# Patient Record
Sex: Female | Born: 1995 | Race: Asian | Hispanic: No | Marital: Single | Smoking: Never smoker
Health system: Southern US, Community
[De-identification: ages and names within clinical notes are randomized; demographics above are authoritative.]

## PROBLEM LIST (undated history)

## (undated) DIAGNOSIS — N6310 Unspecified lump in the right breast, unspecified quadrant: Secondary | ICD-10-CM

## (undated) DIAGNOSIS — K759 Inflammatory liver disease, unspecified: Secondary | ICD-10-CM

## (undated) HISTORY — PX: NO PAST SURGERIES: SHX2092

---
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
## 2017-04-25 ENCOUNTER — Emergency Department (HOSPITAL_COMMUNITY)
Admission: EM | Admit: 2017-04-25 | Discharge: 2017-04-26 | Disposition: A | Discharge status: Home / Self Care | Payer: Self-pay | Attending: Emergency Medicine | Admitting: Emergency Medicine

## 2017-04-25 ENCOUNTER — Other Ambulatory Visit: Payer: Self-pay

## 2017-04-25 ENCOUNTER — Encounter (HOSPITAL_COMMUNITY): Payer: Self-pay

## 2017-04-25 DIAGNOSIS — G43009 Migraine without aura, not intractable, without status migrainosus: Secondary | ICD-10-CM | POA: Insufficient documentation

## 2017-04-25 LAB — COMPREHENSIVE METABOLIC PANEL
ALT: 48 U/L (ref 14–54)
ANION GAP: 9 (ref 5–15)
AST: 32 U/L (ref 15–41)
Albumin: 4.3 g/dL (ref 3.5–5.0)
Alkaline Phosphatase: 44 U/L (ref 38–126)
BILIRUBIN TOTAL: 0.7 mg/dL (ref 0.3–1.2)
BUN: 6 mg/dL (ref 6–20)
CHLORIDE: 103 mmol/L (ref 101–111)
CO2: 24 mmol/L (ref 22–32)
Calcium: 9.4 mg/dL (ref 8.9–10.3)
Creatinine, Ser: 0.71 mg/dL (ref 0.44–1.00)
GFR calc Af Amer: >60 mL/min (ref >60)
GFR calc non Af Amer: >60 mL/min (ref >60)
Glucose, Bld: 91 mg/dL (ref 65–99)
POTASSIUM: 3.6 mmol/L (ref 3.5–5.1)
SODIUM: 136 mmol/L (ref 135–145)
TOTAL PROTEIN: 7.7 g/dL (ref 6.5–8.1)

## 2017-04-25 LAB — LIPASE, BLOOD: LIPASE: 28 U/L (ref 11–51)

## 2017-04-25 LAB — URINALYSIS, ROUTINE W REFLEX MICROSCOPIC
BILIRUBIN URINE: NEGATIVE
Glucose, UA: NEGATIVE mg/dL
HGB URINE DIPSTICK: NEGATIVE
Ketones, ur: 20 mg/dL — AB
NITRITE: NEGATIVE
PROTEIN: 30 mg/dL — AB
Specific Gravity, Urine: 1.023 (ref 1.005–1.030)
pH: 8 (ref 5.0–8.0)

## 2017-04-25 LAB — CBC
HEMATOCRIT: 41.2 % (ref 36.0–46.0)
HEMOGLOBIN: 14.3 g/dL (ref 12.0–15.0)
MCH: 26.9 pg (ref 26.0–34.0)
MCHC: 34.7 g/dL (ref 30.0–36.0)
MCV: 77.4 fL — ABNORMAL LOW (ref 78.0–100.0)
Platelets: 276 10*3/uL (ref 150–400)
RBC: 5.32 MIL/uL — ABNORMAL HIGH (ref 3.87–5.11)
RDW: 13.6 % (ref 11.5–15.5)
WBC: 13.3 10*3/uL — ABNORMAL HIGH (ref 4.0–10.5)

## 2017-04-25 MED ORDER — DIPHENHYDRAMINE HCL 50 MG/ML IJ SOLN
25.0000 mg | Freq: Once | INTRAMUSCULAR | Status: AC
  Administered 2017-04-25: 25 mg via INTRAVENOUS
  Filled 2017-04-25: qty 1

## 2017-04-25 MED ORDER — ACETAMINOPHEN 500 MG PO TABS
1000.0000 mg | ORAL_TABLET | Freq: Once | ORAL | Status: AC
  Administered 2017-04-25: 1000 mg via ORAL
  Filled 2017-04-25: qty 2

## 2017-04-25 MED ORDER — SODIUM CHLORIDE 0.9 % IV BOLUS (SEPSIS)
1000.0000 mL | Freq: Once | INTRAVENOUS | Status: AC
  Administered 2017-04-25: 1000 mL via INTRAVENOUS

## 2017-04-25 MED ORDER — METOCLOPRAMIDE HCL 5 MG/ML IJ SOLN
10.0000 mg | Freq: Once | INTRAMUSCULAR | Status: AC
  Administered 2017-04-25: 10 mg via INTRAVENOUS
  Filled 2017-04-25: qty 2

## 2017-04-25 NOTE — ED Triage Notes (Signed)
Pt states that she has been having a headache and vomiting for the past two days. Pt reports being [redacted] weeks pregnant. Hx of migraines. Some abd pain on and off, generalized, denies diarrhea. Denies urinary symptoms or discharge

## 2017-04-25 NOTE — ED Provider Notes (Signed)
TIME SEEN: 11:36 PM  CHIEF COMPLAINT: Migraine headache  HPI: Patient is a 21 year old female with history of migraine headaches who presents to the emergency department with a frontal migraine for the past 2 days.  Describes it as moderate, throbbing in nature worse with lights.  States normally she takes Tylenol or ibuprofen for the pain or goes to sleep.  She did not take any medication prior to arrival because she is [redacted] weeks pregnant.  She has had nausea and vomiting which is also typical of her migraines.  No fever, neck pain or neck stiffness.  No head injury.  No numbness, tingling or focal weakness.  No abdominal pain, vaginal bleeding or discharge.  Last menstrual period was September 12.  She is being followed by the health department but has not yet had an ultrasound.  She is a G1 P0.  ROS: See HPI Constitutional: no fever  Eyes: no drainage  ENT: no runny nose   Cardiovascular:  no chest pain  Resp: no SOB  GI: no vomiting GU: no dysuria Integumentary: no rash  Allergy: no hives  Musculoskeletal: no leg swelling  Neurological: no slurred speech ROS otherwise negative  PAST MEDICAL HISTORY/PAST SURGICAL HISTORY:  History reviewed. No pertinent past medical history.  MEDICATIONS:  Prior to Admission medications   Medication Sig Start Date End Date Taking? Authorizing Provider  ondansetron (ZOFRAN-ODT) 4 MG disintegrating tablet Take 1 tablet (4 mg total) by mouth once. 07/01/14   Lurene ShadowPhelps, Erin O, PA-C    ALLERGIES:  No Known Allergies  SOCIAL HISTORY:  Social History   Tobacco Use  . Smoking status: Never Smoker  . Smokeless tobacco: Never Used  Substance Use Topics  . Alcohol use: No    Frequency: Never    FAMILY HISTORY: No family history on file.  EXAM: BP 102/71   Pulse 85   Temp 99 F (37.2 C) (Oral)   Resp 20   Ht 5\' 1"  (1.549 m)   Wt 50.8 kg (112 lb)   SpO2 100%   BMI 21.16 kg/m  CONSTITUTIONAL: Alert and oriented and responds appropriately to  questions. Well-appearing; well-nourished HEAD: Normocephalic EYES: Conjunctivae clear, pupils appear equal, EOMI ENT: normal nose; moist mucous membranes NECK: Supple, no meningismus, no nuchal rigidity, no LAD  CARD: RRR; S1 and S2 appreciated; no murmurs, no clicks, no rubs, no gallops RESP: Normal chest excursion without splinting or tachypnea; breath sounds clear and equal bilaterally; no wheezes, no rhonchi, no rales, no hypoxia or respiratory distress, speaking full sentences ABD/GI: Normal bowel sounds; non-distended; soft, non-tender, no rebound, no guarding, no peritoneal signs, no hepatosplenomegaly BACK:  The back appears normal and is non-tender to palpation, there is no CVA tenderness EXT: Normal ROM in all joints; non-tender to palpation; no edema; normal capillary refill; no cyanosis, no calf tenderness or swelling    SKIN: Normal color for age and race; warm; no rash NEURO: Moves all extremities equally, strength 5/5 in all 4 extremities, cranial nerves II through XII intact, normal speech, sensation to light touch intact diffusely PSYCH: The patient's mood and manner are appropriate. Grooming and personal hygiene are appropriate.  MEDICAL DECISION MAKING: Patient here with complaints of a migraine headache.  Has history of similar headaches.  No sudden onset, thunderclap, worst headache of her life.  No neurologic deficits.  No fever or meningismus.  Doubt intracranial hemorrhage, cavernous sinus thrombosis, stroke, meningitis or encephalitis.  I do not feel she needs emergent imaging.  Will give IV  fluids, Reglan, Benadryl and Tylenol as these medications are safe in pregnancy.  Patient comfortable with this plan.  Labs obtained in triage are unremarkable other than mild leukocytosis which can be seen in pregnancy.  She does have small amount of ketones in her urine but we will hydrate her.  No UTI.  Patient would be 10 weeks, 1 day pregnant based on her LMP.  ED PROGRESS: She has  headache and nausea completely resolved.  Will fluid challenge prior to discharge.  Have recommended Tylenol at home as needed for headache.  She has follow-up with the health department.  Discussed return precautions.  Patient and partner are comfortable with plan.   At this time, I do not feel there is any life-threatening condition present. I have reviewed and discussed all results (EKG, imaging, lab, urine as appropriate) and exam findings with patient/family. I have reviewed nursing notes and appropriate previous records.  I feel the patient is safe to be discharged home without further emergent workup and can continue workup as an outpatient as needed. Discussed usual and customary return precautions. Patient/family verbalize understanding and are comfortable with this plan.  Outpatient follow-up has been provided if needed. All questions have been answered.      Sham Alviar, Layla MawKristen N, DO 04/26/17 [phone]

## 2017-04-26 NOTE — Discharge Instructions (Signed)
You may take Tylenol 1000 mg every 6 hours as needed for pain.  Please do not take NSAIDs such as ibuprofen, aspirin, Aleve as these are not safe in pregnancy.

## 2017-04-29 ENCOUNTER — Encounter (HOSPITAL_COMMUNITY): Payer: Self-pay | Admitting: *Deleted

## 2017-04-29 ENCOUNTER — Other Ambulatory Visit: Payer: Self-pay

## 2017-04-29 DIAGNOSIS — R51 Headache: Secondary | ICD-10-CM | POA: Insufficient documentation

## 2017-04-29 DIAGNOSIS — R110 Nausea: Secondary | ICD-10-CM | POA: Insufficient documentation

## 2017-04-29 DIAGNOSIS — N390 Urinary tract infection, site not specified: Secondary | ICD-10-CM | POA: Insufficient documentation

## 2017-04-29 LAB — URINALYSIS, ROUTINE W REFLEX MICROSCOPIC
Bilirubin Urine: NEGATIVE
Glucose, UA: NEGATIVE mg/dL
Hgb urine dipstick: NEGATIVE
Ketones, ur: 20 mg/dL — AB
NITRITE: NEGATIVE
PH: 5 (ref 5.0–8.0)
PROTEIN: 30 mg/dL — AB
Specific Gravity, Urine: 1.03 (ref 1.005–1.030)

## 2017-04-29 LAB — I-STAT CHEM 8, ED
BUN: 9 mg/dL (ref 6–20)
CALCIUM ION: 1.19 mmol/L (ref 1.15–1.40)
CHLORIDE: 101 mmol/L (ref 101–111)
Creatinine, Ser: 0.6 mg/dL (ref 0.44–1.00)
Glucose, Bld: 100 mg/dL — ABNORMAL HIGH (ref 65–99)
HCT: 44 % (ref 36.0–46.0)
Hemoglobin: 15 g/dL (ref 12.0–15.0)
Potassium: 3.6 mmol/L (ref 3.5–5.1)
SODIUM: 137 mmol/L (ref 135–145)
TCO2: 23 mmol/L (ref 22–32)

## 2017-04-29 NOTE — ED Triage Notes (Signed)
Pt states that she is pregnant, 9 weeks tomorrow) and has continued to have nausea and vomiting.  Pt has hx of migraines and continues to have a severe HA with photophobia (headache alternates between back of head and right front).  Pt states that her nausea has prevented her from really eating or drinking much for the past week.

## 2017-04-30 ENCOUNTER — Emergency Department (HOSPITAL_COMMUNITY)
Admission: EM | Admit: 2017-04-30 | Discharge: 2017-04-30 | Disposition: A | Discharge status: Home / Self Care | Payer: Self-pay | Attending: Emergency Medicine | Admitting: Emergency Medicine

## 2017-04-30 DIAGNOSIS — R51 Headache: Secondary | ICD-10-CM

## 2017-04-30 DIAGNOSIS — R110 Nausea: Secondary | ICD-10-CM

## 2017-04-30 DIAGNOSIS — R519 Headache, unspecified: Secondary | ICD-10-CM

## 2017-04-30 DIAGNOSIS — N390 Urinary tract infection, site not specified: Secondary | ICD-10-CM

## 2017-04-30 MED ORDER — SODIUM CHLORIDE 0.9 % IV BOLUS (SEPSIS)
1000.0000 mL | Freq: Once | INTRAVENOUS | Status: AC
  Administered 2017-04-30: 1000 mL via INTRAVENOUS

## 2017-04-30 MED ORDER — DIPHENHYDRAMINE HCL 50 MG/ML IJ SOLN
25.0000 mg | Freq: Once | INTRAMUSCULAR | Status: AC
  Administered 2017-04-30: 25 mg via INTRAVENOUS
  Filled 2017-04-30: qty 1

## 2017-04-30 MED ORDER — METOCLOPRAMIDE HCL 5 MG/ML IJ SOLN
10.0000 mg | Freq: Once | INTRAMUSCULAR | Status: AC
  Administered 2017-04-30: 10 mg via INTRAVENOUS
  Filled 2017-04-30: qty 2

## 2017-04-30 MED ORDER — CEPHALEXIN 500 MG PO CAPS: 500.0000 mg | ORAL_CAPSULE | Freq: Two times a day (BID) | ORAL | 0 refills | Status: DC

## 2017-04-30 MED ORDER — ACETAMINOPHEN 500 MG PO TABS
1000.0000 mg | ORAL_TABLET | Freq: Once | ORAL | Status: AC
  Administered 2017-04-30: 1000 mg via ORAL
  Filled 2017-04-30: qty 2

## 2017-04-30 MED ORDER — DEXTROSE 5 % IV SOLN
1.0000 g | Freq: Once | INTRAVENOUS | Status: AC
  Administered 2017-04-30: 1 g via INTRAVENOUS
  Filled 2017-04-30: qty 10

## 2017-04-30 NOTE — ED Provider Notes (Signed)
[facility] Provider Note   CSN: [id number] Arrival date & time: 04/29/17  2134     History   Chief Complaint Chief Complaint  Patient presents with  . Morning Sickness  . Headache    HPI Carrie Erickson is a 21 y.o. female.  Patient presents to the emergency department with a chief complaint of migraine.  She reports a history of migraines.  States that this headache feels similar to her prior migraines.  She reports that the symptoms started about 4 days ago.  She reports that her symptoms have gradually worsened.  She reports associated phonophobia and photophobia.  She denies any numbness, weakness, or tingling.  Denies any vision changes or speech changes.  Of note, patient is [redacted] weeks pregnant.  She was seen last week for the same and had good improvement after migraine cocktail.  She states that she does not see a headache specialist.  She denies any lower abdominal pain, vaginal bleeding, or dysuria.   The history is provided by the patient. No language interpreter was used.    History reviewed. No pertinent past medical history.  There are no active problems to display for this patient.   History reviewed. No pertinent surgical history.  OB History    Gravida Para Term Preterm AB Living   1             SAB TAB Ectopic Multiple Live Births                   Home Medications    Prior to Admission medications   Medication Sig Start Date End Date Taking? Authorizing Provider  ondansetron (ZOFRAN-ODT) 4 MG disintegrating tablet Take 1 tablet (4 mg total) by mouth once. 07/01/14   Lurene ShadowPhelps, Erin O, PA-C    Family History No family history on file.  Social History Social History   Tobacco Use  . Smoking status: Never Smoker  . Smokeless tobacco: Never Used  Substance Use Topics  . Alcohol use: No    Frequency: Never  . Drug use: No     Allergies   Patient has no known allergies.   Review of Systems Review of  Systems  All other systems reviewed and are negative.    Physical Exam Updated Vital Signs BP 122/77 (BP Location: Right Arm)   Pulse 96   Temp 98.8 F (37.1 C) (Oral)   Resp 16   LMP  (Exact Date)   SpO2 100%   Physical Exam  Constitutional: She is oriented to person, place, and time. She appears well-developed and well-nourished.  HENT:  Head: Normocephalic and atraumatic.  Right Ear: External ear normal.  Left Ear: External ear normal.  Eyes: Conjunctivae and EOM are normal. Pupils are equal, round, and reactive to light.  Neck: Normal range of motion. Neck supple.  No pain with neck flexion, no meningismus  Cardiovascular: Normal rate, regular rhythm and normal heart sounds. Exam reveals no gallop and no friction rub.  No murmur heard. Pulmonary/Chest: Effort normal and breath sounds normal. No respiratory distress. She has no wheezes. She has no rales. She exhibits no tenderness.  Abdominal: Soft. She exhibits no distension and no mass. There is no tenderness. There is no rebound and no guarding.  Musculoskeletal: Normal range of motion. She exhibits no edema or tenderness.  Normal gait.  Neurological: She is alert and oriented to person, place, and time. She has normal reflexes.  CN 3-12 intact, normal finger to  nose, no pronator drift, sensation and strength intact bilaterally.  Skin: Skin is warm and dry.  Psychiatric: She has a normal mood and affect. Her behavior is normal. Judgment and thought content normal.  Nursing note and vitals reviewed.    ED Treatments / Results  Labs (all labs ordered are listed, but only abnormal results are displayed) Labs Reviewed  URINALYSIS, ROUTINE W REFLEX MICROSCOPIC - Abnormal; Notable for the following components:      Result Value   Color, Urine AMBER (*)    APPearance CLOUDY (*)    Ketones, ur 20 (*)    Protein, ur 30 (*)    Leukocytes, UA LARGE (*)    Bacteria, UA MANY (*)    Squamous Epithelial / LPF TOO NUMEROUS TO  COUNT (*)    Non Squamous Epithelial 0-5 (*)    All other components within normal limits  I-STAT CHEM 8, ED - Abnormal; Notable for the following components:   Glucose, Bld 100 (*)    All other components within normal limits    EKG  EKG Interpretation None       Radiology No results found.  Procedures Procedures (including critical care time)  Medications Ordered in ED Medications  metoCLOPramide (REGLAN) injection 10 mg (10 mg Intravenous Given 04/30/17 0437)  diphenhydrAMINE (BENADRYL) injection 25 mg (25 mg Intravenous Given 04/30/17 0436)  acetaminophen (TYLENOL) tablet 1,000 mg (1,000 mg Oral Given 04/30/17 0426)  sodium chloride 0.9 % bolus 1,000 mL (1,000 mLs Intravenous New Bag/Given 04/30/17 0436)  cefTRIAXone (ROCEPHIN) 1 g in dextrose 5 % 50 mL IVPB (0 g Intravenous Stopped 04/30/17 0540)     Initial Impression / Assessment and Plan / ED Course  I have reviewed the triage vital signs and the nursing notes.  Pertinent labs & imaging results that were available during my care of the patient were reviewed by me and considered in my medical decision making (see chart for details).     Patient with headache.  Headache is similar to prior migraines.  Will treat symptoms.  Patient has also had some nausea.  Urinalysis is questionable for UTI.  Given patient is pregnant, will treat, and will send for culture.  5:13 AM Patient reports that her headache has significantly improved.  I have advised patient to follow-up with neurology on an outpatient basis for her recurrent headaches.  Vital signs are stable.  Patient understands and agrees with the plan.     Final Clinical Impressions(s) / ED Diagnoses   Final diagnoses:  Nonintractable headache, unspecified chronicity pattern, unspecified headache type  Urinary tract infection without hematuria, site unspecified  Nausea    ED Discharge Orders        Ordered    cephALEXin (KEFLEX) 500 MG capsule  2 times daily      04/30/17 0515       Roxy HorsemanBrowning, Syanna Remmert, PA-C 04/30/17 0544    Zadie RhineWickline, Donald, MD 04/30/17 [phone]

## 2017-04-30 NOTE — ED Notes (Signed)
Aware she needs a urine attempting.

## 2017-04-30 NOTE — ED Notes (Signed)
C/o headache with vomiting onset last week. States she was seen in the ED last Thurs. For same. C/;o decrease appetite.

## 2017-05-01 LAB — URINE CULTURE: Culture: NO GROWTH

## 2017-05-13 DIAGNOSIS — Z3401 Encounter for supervision of normal first pregnancy, first trimester: Secondary | ICD-10-CM | POA: Diagnosis not present

## 2017-05-13 LAB — OB RESULTS CONSOLE HEPATITIS B SURFACE ANTIGEN: HEP B S AG: POSITIVE

## 2017-05-13 LAB — OB RESULTS CONSOLE GC/CHLAMYDIA
Chlamydia: NEGATIVE
Gonorrhea: NEGATIVE

## 2017-05-13 LAB — OB RESULTS CONSOLE ABO/RH: RH Type: POSITIVE

## 2017-05-13 LAB — OB RESULTS CONSOLE HIV ANTIBODY (ROUTINE TESTING): HIV: NONREACTIVE

## 2017-05-13 LAB — OB RESULTS CONSOLE ANTIBODY SCREEN: ANTIBODY SCREEN: NEGATIVE

## 2017-05-13 LAB — OB RESULTS CONSOLE RUBELLA ANTIBODY, IGM: Rubella: IMMUNE

## 2017-05-13 LAB — OB RESULTS CONSOLE RPR: RPR: NONREACTIVE

## 2017-05-14 ENCOUNTER — Other Ambulatory Visit (HOSPITAL_COMMUNITY): Payer: Self-pay | Admitting: Nurse Practitioner

## 2017-05-14 DIAGNOSIS — Z3A13 13 weeks gestation of pregnancy: Secondary | ICD-10-CM

## 2017-05-14 DIAGNOSIS — Z369 Encounter for antenatal screening, unspecified: Secondary | ICD-10-CM

## 2017-05-23 ENCOUNTER — Encounter (HOSPITAL_COMMUNITY): Payer: Self-pay | Admitting: Pediatrics

## 2017-05-29 ENCOUNTER — Encounter (HOSPITAL_COMMUNITY): Payer: Self-pay | Admitting: *Deleted

## 2017-05-31 ENCOUNTER — Other Ambulatory Visit (HOSPITAL_COMMUNITY): Payer: Self-pay | Admitting: *Deleted

## 2017-05-31 ENCOUNTER — Encounter (HOSPITAL_COMMUNITY): Payer: Self-pay

## 2017-05-31 ENCOUNTER — Ambulatory Visit (HOSPITAL_COMMUNITY): Admission: RE | Admit: 2017-05-31 | Payer: Self-pay | Source: Ambulatory Visit

## 2017-05-31 ENCOUNTER — Other Ambulatory Visit (HOSPITAL_COMMUNITY): Payer: Self-pay | Admitting: Nurse Practitioner

## 2017-05-31 ENCOUNTER — Ambulatory Visit (HOSPITAL_COMMUNITY)
Admission: RE | Admit: 2017-05-31 | Discharge: 2017-05-31 | Disposition: A | Discharge status: Home / Self Care | Payer: Self-pay | Source: Ambulatory Visit | Attending: Nurse Practitioner | Admitting: Nurse Practitioner

## 2017-05-31 DIAGNOSIS — Z3A13 13 weeks gestation of pregnancy: Secondary | ICD-10-CM

## 2017-05-31 DIAGNOSIS — Z3687 Encounter for antenatal screening for uncertain dates: Secondary | ICD-10-CM

## 2017-05-31 DIAGNOSIS — Z369 Encounter for antenatal screening, unspecified: Secondary | ICD-10-CM

## 2017-05-31 DIAGNOSIS — Z3A11 11 weeks gestation of pregnancy: Secondary | ICD-10-CM | POA: Insufficient documentation

## 2017-05-31 DIAGNOSIS — Z3682 Encounter for antenatal screening for nuchal translucency: Secondary | ICD-10-CM

## 2017-06-07 ENCOUNTER — Encounter (HOSPITAL_COMMUNITY): Payer: Self-pay

## 2017-06-07 ENCOUNTER — Ambulatory Visit (HOSPITAL_COMMUNITY)
Admission: RE | Admit: 2017-06-07 | Discharge: 2017-06-07 | Disposition: A | Discharge status: Home / Self Care | Payer: Self-pay | Source: Ambulatory Visit | Attending: Nurse Practitioner | Admitting: Nurse Practitioner

## 2017-06-07 DIAGNOSIS — Z3682 Encounter for antenatal screening for nuchal translucency: Secondary | ICD-10-CM | POA: Insufficient documentation

## 2017-06-07 DIAGNOSIS — Z3A12 12 weeks gestation of pregnancy: Secondary | ICD-10-CM | POA: Insufficient documentation

## 2017-06-17 ENCOUNTER — Other Ambulatory Visit (HOSPITAL_COMMUNITY): Payer: Self-pay

## 2017-06-18 NOTE — L&D Delivery Note (Addendum)
Patient is a 22 y.o. now G1P1001 s/p NSVD at 5211w4d, who was admitted for IOL for BPP 6/8.  She progressed with augmentation (AROM, Pitocin, FB) to complete and pushed ~45 minutes to deliver.  Cord clamping delayed by 1-3 minutes then clamped by me with supervision and cut by FOB.  Placenta intact and spontaneous, bleeding minimal.  2nd degree vaginal and R labial lacerations repaired without difficulty. She is undecided for birth control but plans to use condoms in the interim.  Delivery Note At 4:12 AM a viable female was delivered via Vaginal, Spontaneous (Presentation: ROA).  APGAR: 9, 9; weight pending.   Placenta status: intact.  Cord: 3V with the following complications: none.  Cord pH: N/A  Anesthesia:  Epidural Episiotomy: None Lacerations: 2nd degree;Vaginal;Labial Suture Repair: 3.0 vicryl rapide Est. Blood Loss (mL):  200  Mom to postpartum.  Baby to Couplet care / Skin to Skin.  Ellwood DenseAlison Rumball, DO 12/21/17, 4:56 AM  Midwife attestation: I was gloved and present for delivery in its entirety and I agree with the above resident's note.  Donette LarryMelanie Demon Volante, CNM 5:29 AM

## 2017-11-21 LAB — OB RESULTS CONSOLE GC/CHLAMYDIA
CHLAMYDIA, DNA PROBE: NEGATIVE
Gonorrhea: NEGATIVE

## 2017-11-21 LAB — OB RESULTS CONSOLE GBS: STREP GROUP B AG: NEGATIVE

## 2017-12-18 ENCOUNTER — Other Ambulatory Visit (HOSPITAL_COMMUNITY): Payer: Self-pay | Admitting: Nurse Practitioner

## 2017-12-18 ENCOUNTER — Telehealth (HOSPITAL_COMMUNITY): Payer: Self-pay | Admitting: *Deleted

## 2017-12-18 DIAGNOSIS — O480 Post-term pregnancy: Secondary | ICD-10-CM

## 2017-12-18 NOTE — Telephone Encounter (Signed)
Preadmission screen  

## 2017-12-20 ENCOUNTER — Ambulatory Visit (HOSPITAL_BASED_OUTPATIENT_CLINIC_OR_DEPARTMENT_OTHER)
Admission: RE | Admit: 2017-12-20 | Discharge: 2017-12-20 | Disposition: A | Discharge status: Home / Self Care | Payer: Medicaid Other | Source: Ambulatory Visit | Attending: Nurse Practitioner | Admitting: Nurse Practitioner

## 2017-12-20 ENCOUNTER — Inpatient Hospital Stay (HOSPITAL_COMMUNITY): Payer: Medicaid Other | Admitting: Anesthesiology

## 2017-12-20 ENCOUNTER — Other Ambulatory Visit: Payer: Self-pay

## 2017-12-20 ENCOUNTER — Encounter (HOSPITAL_COMMUNITY): Payer: Self-pay | Admitting: *Deleted

## 2017-12-20 ENCOUNTER — Inpatient Hospital Stay (HOSPITAL_COMMUNITY)
Admission: AD | Admit: 2017-12-20 | Discharge: 2017-12-23 | DRG: 806 | Disposition: A | Discharge status: Home / Self Care | Payer: Medicaid Other | Attending: Family Medicine | Admitting: Family Medicine

## 2017-12-20 DIAGNOSIS — O480 Post-term pregnancy: Secondary | ICD-10-CM

## 2017-12-20 DIAGNOSIS — B1910 Unspecified viral hepatitis B without hepatic coma: Secondary | ICD-10-CM

## 2017-12-20 DIAGNOSIS — Z3A40 40 weeks gestation of pregnancy: Secondary | ICD-10-CM | POA: Insufficient documentation

## 2017-12-20 DIAGNOSIS — O9842 Viral hepatitis complicating childbirth: Secondary | ICD-10-CM | POA: Diagnosis present

## 2017-12-20 DIAGNOSIS — Z3480 Encounter for supervision of other normal pregnancy, unspecified trimester: Secondary | ICD-10-CM

## 2017-12-20 DIAGNOSIS — Z9289 Personal history of other medical treatment: Secondary | ICD-10-CM

## 2017-12-20 DIAGNOSIS — O98419 Viral hepatitis complicating pregnancy, unspecified trimester: Secondary | ICD-10-CM

## 2017-12-20 DIAGNOSIS — Z3400 Encounter for supervision of normal first pregnancy, unspecified trimester: Secondary | ICD-10-CM

## 2017-12-20 HISTORY — DX: Unspecified viral hepatitis B without hepatic coma: B19.10

## 2017-12-20 HISTORY — DX: Personal history of other medical treatment: Z92.89

## 2017-12-20 HISTORY — DX: Encounter for supervision of normal first pregnancy, unspecified trimester: Z34.00

## 2017-12-20 HISTORY — DX: Inflammatory liver disease, unspecified: K75.9

## 2017-12-20 HISTORY — DX: Unspecified lump in the right breast, unspecified quadrant: N63.10

## 2017-12-20 HISTORY — DX: Unspecified viral hepatitis B without hepatic coma: O98.419

## 2017-12-20 LAB — CBC
HCT: 41.4 % (ref 36.0–46.0)
Hemoglobin: 14.2 g/dL (ref 12.0–15.0)
MCH: 27.8 pg (ref 26.0–34.0)
MCHC: 34.3 g/dL (ref 30.0–36.0)
MCV: 81.2 fL (ref 78.0–100.0)
Platelets: 235 10*3/uL (ref 150–400)
RBC: 5.1 MIL/uL (ref 3.87–5.11)
RDW: 13.7 % (ref 11.5–15.5)
WBC: 10.1 10*3/uL (ref 4.0–10.5)

## 2017-12-20 LAB — TYPE AND SCREEN
ABO/RH(D): A POS
Antibody Screen: NEGATIVE

## 2017-12-20 LAB — ABO/RH: ABO/RH(D): A POS

## 2017-12-20 MED ORDER — ONDANSETRON HCL 4 MG/2ML IJ SOLN: 4.0000 mg | Freq: Four times a day (QID) | INTRAMUSCULAR | Status: DC | PRN

## 2017-12-20 MED ORDER — DIPHENHYDRAMINE HCL 50 MG/ML IJ SOLN: 12.5000 mg | INTRAMUSCULAR | Status: DC | PRN

## 2017-12-20 MED ORDER — LIDOCAINE HCL (PF) 1 % IJ SOLN
30.0000 mL | INTRAMUSCULAR | Status: DC | PRN
  Filled 2017-12-20: qty 30

## 2017-12-20 MED ORDER — LACTATED RINGERS IV SOLN
500.0000 mL | Freq: Once | INTRAVENOUS | Status: AC
  Administered 2017-12-20: 500 mL via INTRAVENOUS

## 2017-12-20 MED ORDER — EPHEDRINE 5 MG/ML INJ
10.0000 mg | INTRAVENOUS | Status: DC | PRN
  Filled 2017-12-20: qty 2

## 2017-12-20 MED ORDER — PHENYLEPHRINE 40 MCG/ML (10ML) SYRINGE FOR IV PUSH (FOR BLOOD PRESSURE SUPPORT)
80.0000 ug | PREFILLED_SYRINGE | INTRAVENOUS | Status: DC | PRN
  Filled 2017-12-20: qty 5
  Filled 2017-12-20: qty 10

## 2017-12-20 MED ORDER — PHENYLEPHRINE 40 MCG/ML (10ML) SYRINGE FOR IV PUSH (FOR BLOOD PRESSURE SUPPORT)
80.0000 ug | PREFILLED_SYRINGE | INTRAVENOUS | Status: DC | PRN
  Filled 2017-12-20: qty 5

## 2017-12-20 MED ORDER — OXYTOCIN BOLUS FROM INFUSION
500.0000 mL | Freq: Once | INTRAVENOUS | Status: AC
  Administered 2017-12-21: 500 mL via INTRAVENOUS

## 2017-12-20 MED ORDER — ACETAMINOPHEN 325 MG PO TABS: 650.0000 mg | ORAL_TABLET | ORAL | Status: DC | PRN

## 2017-12-20 MED ORDER — OXYTOCIN 40 UNITS IN LACTATED RINGERS INFUSION - SIMPLE MED
2.5000 [IU]/h | INTRAVENOUS | Status: DC
  Administered 2017-12-21: 2.5 [IU]/h via INTRAVENOUS
  Filled 2017-12-20: qty 1000

## 2017-12-20 MED ORDER — FENTANYL 2.5 MCG/ML BUPIVACAINE 1/10 % EPIDURAL INFUSION (WH - ANES)
14.0000 mL/h | INTRAMUSCULAR | Status: DC | PRN
  Administered 2017-12-20: 14 mL/h via EPIDURAL
  Filled 2017-12-20: qty 100

## 2017-12-20 MED ORDER — LACTATED RINGERS IV SOLN: 500.0000 mL | INTRAVENOUS | Status: DC | PRN

## 2017-12-20 MED ORDER — OXYTOCIN 40 UNITS IN LACTATED RINGERS INFUSION - SIMPLE MED
1.0000 m[IU]/min | INTRAVENOUS | Status: DC
  Administered 2017-12-20: 1 m[IU]/min via INTRAVENOUS

## 2017-12-20 MED ORDER — SOD CITRATE-CITRIC ACID 500-334 MG/5ML PO SOLN: 30.0000 mL | ORAL | Status: DC | PRN

## 2017-12-20 MED ORDER — OXYCODONE-ACETAMINOPHEN 5-325 MG PO TABS: 2.0000 | ORAL_TABLET | ORAL | Status: DC | PRN

## 2017-12-20 MED ORDER — TERBUTALINE SULFATE 1 MG/ML IJ SOLN
0.2500 mg | Freq: Once | INTRAMUSCULAR | Status: DC | PRN
  Filled 2017-12-20: qty 1

## 2017-12-20 MED ORDER — LACTATED RINGERS IV SOLN: 500.0000 mL | Freq: Once | INTRAVENOUS | Status: DC

## 2017-12-20 MED ORDER — OXYCODONE-ACETAMINOPHEN 5-325 MG PO TABS: 1.0000 | ORAL_TABLET | ORAL | Status: DC | PRN

## 2017-12-20 MED ORDER — LIDOCAINE HCL (PF) 1 % IJ SOLN
INTRAMUSCULAR | Status: DC | PRN
  Administered 2017-12-20: 3 mL via EPIDURAL
  Administered 2017-12-20: 5 mL via EPIDURAL

## 2017-12-20 MED ORDER — LACTATED RINGERS IV SOLN
INTRAVENOUS | Status: DC
  Administered 2017-12-20 (×3): via INTRAVENOUS

## 2017-12-20 NOTE — Anesthesia Pain Management Evaluation Note (Signed)
  [facility] Pain Management Visit Note  Patient: Carrie Erickson, 22 y.o., female  "Hello I am a [facility] the [facility]. We have an anesthesia team available at all times to provide care throughout the hospital, including epidural management and anesthesia for C-section. I don't know your plan for the delivery whether it a natural birth, water birth, IV sedation, nitrous supplementation, doula or epidural, but we want to meet your pain goals."   1.Was your pain managed to your expectations on prior hospitalizations?   No prior hospitalizations  2.What is your expectation for pain management during this hospitalization?     Epidural  3.How can we help you reach that goal? epidural  Record the patient's initial score and the patient's pain goal.   Pain: 0  Pain Goal: 5 [facility] wants you to be able to say your pain was always managed very well.  Dilynn Munroe 12/20/2017

## 2017-12-20 NOTE — Anesthesia Procedure Notes (Signed)
Epidural Patient location during procedure: OB Start time: 12/20/2017 8:44 PM End time: 12/20/2017 8:53 PM  Staffing Anesthesiologist: Achille RichHodierne, Casyn Becvar, MD Performed: anesthesiologist   Preanesthetic Checklist Completed: patient identified, site marked, pre-op evaluation, timeout performed, IV checked, risks and benefits discussed and monitors and equipment checked  Epidural Patient position: sitting Prep: DuraPrep Patient monitoring: heart rate, cardiac monitor, continuous pulse ox and blood pressure Approach: midline Location: L2-L3 Injection technique: LOR saline  Needle:  Needle type: Tuohy  Needle gauge: 17 G Needle length: 9 cm Needle insertion depth: 4 cm Catheter type: closed end flexible Catheter size: 19 Gauge Catheter at skin depth: 10 cm Test dose: negative and Other  Assessment Events: blood not aspirated, injection not painful, no injection resistance and negative IV test  Additional Notes Informed consent obtained prior to proceeding including risk of failure, 1% risk of PDPH, risk of minor discomfort and bruising.  Discussed rare but serious complications including epidural abscess, permanent nerve injury, epidural hematoma.  Discussed alternatives to epidural analgesia and patient desires to proceed.  Timeout performed pre-procedure verifying patient name, procedure, and platelet count.  Patient tolerated procedure well. Reason for block:procedure for pain

## 2017-12-20 NOTE — H&P (Deleted)
Patient ID: Carrie Erickson, female   DOB: 08/23/1995, 21 y.o.   MRN: [id number] OBSTETRIC ADMISSION HISTORY AND PHYSICAL  Carrie Erickson is a 21 y.o. female G1P0 with IUP at [redacted]w[redacted]d by US at  presenting for IOL due to BPP of 6/8. 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 She reports +FMs, No LOF, no VB, no blurry vision, headaches or peripheral edema, and RUQ pain. She endorses feeling a contraction this morning that was about a 5/10 on pain scale but she was able to breath through it and has felt contractions occasionally throughout the morning. She plans on breast and bottle feeding. She request condoms for birth control at this time but would like to talk about other options later. She received her prenatal care at [facility]   Dating: By US at 11 weeks --->  Estimated Date of Delivery: 12/17/17  Sono:    @[redacted]w[redacted]d, CWD, normal anatomy, Variable presentation   Prenatal History/Complications:  Past Medical History: Past Medical History:  Diagnosis Date  . Breast mass, right   . Hepatitis     Past Surgical History: Past Surgical History:  Procedure Laterality Date  . NO PAST SURGERIES      Obstetrical History: OB History    Gravida  1   Para      Term      Preterm      AB      Living        SAB      TAB      Ectopic      Multiple      Live Births              Social History: Social History   Socioeconomic History  . Marital status: Single    Spouse name: Not on file  . Number of children: Not on file  . Years of education: Not on file  . Highest education level: Not on file  Occupational History  . Not on file  Social Needs  . Financial resource strain: Not on file  . Food insecurity:    Worry: Not on file    Inability: Not on file  . Transportation needs:    Medical: Not on file    Non-medical: Not on file  Tobacco Use  . Smoking status: Never Smoker  . Smokeless tobacco: Never Used  Substance and Sexual Activity  . Alcohol use: No     Frequency: Never  . Drug use: No  . Sexual activity: Yes  Lifestyle  . Physical activity:    Days per week: Not on file    Minutes per session: Not on file  . Stress: Not on file  Relationships  . Social connections:    Talks on phone: Not on file    Gets together: Not on file    Attends religious service: Not on file    Active member of club or organization: Not on file    Attends meetings of clubs or organizations: Not on file    Relationship status: Not on file  Other Topics Concern  . Not on file  Social History Narrative  . Not on file    Family History: History reviewed. No pertinent family history.  Allergies: No Known Allergies  Medications Prior to Admission  Medication Sig Dispense Refill Last Dose  . Prenatal Vit-Fe Fumarate-FA (PRENATAL VITAMIN PO) Take 1 tablet by mouth daily.    12/19/2017 at Unknown time  . Promethazine HCl (PHENERGAN PO)   Take by mouth.   Past Month at Unknown time     Review of Systems   All systems reviewed and negative except as stated in HPI  Blood pressure 130/85, pulse 99, temperature 98.2 F (36.8 C), temperature source Oral, resp. rate 18, height 5' (1.524 m), weight 63.9 kg (140 lb 12.8 oz), last menstrual period 02/27/2017. General appearance: alert, cooperative and no distress Lungs: clear to auscultation bilaterally Heart: regular rate and rhythm Abdomen: soft, mild tenderness; bowel sounds normal Extremities: Homans sign is negative, no sign of DVT Presentation: Vertex Fetal monitoringBaseline: 145 bpm, Variability: Good {> 6 bpm), Accelerations: Present and Decelerations: Absent Uterine activityDate/time of onset: Morning of 12/20/17, Frequency: Occasionally and Intensity: mild Dilation: 1 Effacement (%): 50 Station: -2 Exam by:: Sam CNM   Prenatal labs: ABO, Rh: A/Positive/-- (11/26 0000) Antibody: Negative (11/26 0000) Rubella: Immune (11/26 0000) RPR: Nonreactive (11/26 0000)  HBsAg: Positive (11/26 0000)   HIV: Non-reactive (11/26 0000)  GBS: Negative (06/06 0000)  Anatomy US: Normal at 12 weeks  Prenatal Transfer Tool  Maternal Diabetes: No Genetic Screening: Declined Maternal Ultrasounds/Referrals: Normal Fetal Ultrasounds or other Referrals:  Other: Anatomy scan at 11 weeks Maternal Substance Abuse:  No Significant Maternal Medications:  None Significant Maternal Lab Results: None  Results for orders placed or performed during the hospital encounter of 12/20/17 (from the past 24 hour(s))  CBC   Collection Time: 12/20/17 10:12 AM  Result Value Ref Range   WBC 10.1 4.0 - 10.5 K/uL   RBC 5.10 3.87 - 5.11 MIL/uL   Hemoglobin 14.2 12.0 - 15.0 g/dL   HCT 41.4 36.0 - 46.0 %   MCV 81.2 78.0 - 100.0 fL   MCH 27.8 26.0 - 34.0 pg   MCHC 34.3 30.0 - 36.0 g/dL   RDW 13.7 11.5 - 15.5 %   Platelets 235 150 - 400 K/uL    Patient Active Problem List   Diagnosis Date Noted  . Supervision of normal first pregnancy, antepartum 12/20/2017  . H/O fetal biophysical profile 12/20/2017  . Hep B complicating pregnancy 12/20/2017    Assessment/Plan:  Carrie Erickson is a 21 y.o. G1P0 at [redacted]w[redacted]d here for IOL due to BPP of 6/8 with Hx of Hep B.  #Labor: Not currently in labor #Pain: Labor management without medications #FWB: Category 1 #ID:None #MOF: Breast and Bottle #MOC:Condoms but would be open to discussing other contraceptions methods later  Lexi J Betancourt, Medical Student  12/20/2017, 11:29 AM  RESIDENT ADDENDUM I have separately seen and examined the patient. I have discussed the findings and exam with the medical student and agree with the above note. I helped develop the management plan that is described in the student's note, and I agree with the content.   Dispo: Admit to [facility] Suit[facility] for IOL  Velencia Lenart, CNM 12/20/17  11:59 AM      

## 2017-12-20 NOTE — Progress Notes (Signed)
Labor Progress Note Lanetta Inchhinh Kierstead is a 22 y.o. G1P0 at 6778w3d presented for IOL for nonreassuring NST, BPP 6/8.  S: Patient immediately s/p epidural. No questions at this time.  O:  BP 113/79   Pulse 94   Temp 98.7 F (37.1 C) (Oral)   Resp 19   Ht 5' (1.524 m)   Wt 140 lb 12.8 oz (63.9 kg)   LMP 02/27/2017   SpO2 99%   BMI 27.50 kg/m   WUJ:WJXBJYNWFHT:baseline rate 150, moderate variability, + acels, no decels Toco: ctx every 1-3 min  CVE: Dilation: 6 Effacement (%): 70 Cervical Position: Middle Station: -1 Presentation: Vertex Exam by:: Montez MoritaErin Hampton, RNC  A&P: 22 y.o. G1P0 6678w3d here for IOL for BPP 6/8. Labor: Progressing well. Currently on 8 of Pit, titrating as tolerated. Pain: Epidural FWB: Cat I GBS negative  Ellwood DenseAlison Margarete Horace, DO 9:22 PM

## 2017-12-20 NOTE — Progress Notes (Addendum)
Patient ID: Carrie Erickson, female   DOB: [birth year], 22 y.o.   MRN: [id number] Labor Progress Note  Carrie Erickson is a 22 y.o. G1P0 at 3757w3d  [facility]  S: Tolerating pain well so far. Having irregular contractions with mild pain and states that she does not currently need pain medication but plans to get an epidural later. Denies HA, N/v, vision changes, or RUQ pain.   O:  BP 123/83   Pulse 87   Temp 98.2 F (36.8 C) (Oral)   Resp 16   Ht 5' (1.524 m)   Wt 63.9 kg (140 lb 12.8 oz)   LMP 02/27/2017   BMI 27.50 kg/m   No intake/output data recorded.  FHT:  FHR: 140 bpm, variability: moderate,  accelerations:  Present,  decelerations:  Absent UC:   Irregularly with mild pain SVE:   Dilation: 1 Effacement (%): 50 Station: -2 Exam by:: Sam CNM Membranes still intact  Labs: Lab Results  Component Value Date   WBC 10.1 12/20/2017   HGB 14.2 12/20/2017   HCT 41.4 12/20/2017   MCV 81.2 12/20/2017   PLT 235 12/20/2017    Assessment / Plan: 22 y.o. G1P0 6557w3d by first trimester US presenting for IOL for BPP of 6/8 not currently in labor. IOL for BPP 6/8 this morning  Labor: Not currently in labor  Fetal Wellbeing:  Category I Pain Control:  Labor support without medications Anticipated MOD:  NSVD  Expectant management   [place] PinkLexi Betancourt, MS3 12/20/17 14:06  RESIDENT ADDENDUM I have separately seen and examined the patient. I have discussed the findings and exam with the medical student and agree with the above note. I helped develop the management plan that is described in the student's note, and I agree with the content.  Clayton BiblesSamantha Ifeoluwa Beller, CNM 12/20/17  2:25 PM

## 2017-12-20 NOTE — Progress Notes (Addendum)
Patient ID: Lanetta Inchhinh Weyand, female   DOB: Nov 05, 1995, 22 y.o.   MRN: [id number] Labor Progress Note  Lanetta Inchhinh Kercheval is a 22 y.o. G1P0 at 357w3d  admitted for induction of labor due to BPP of 6/8 this morning.  S: Tolerating it well overall. Pain ins increasing with contractions and is now a 6/10 during contractions. States she does not want any pain medication now but will want and epidural when the pain gets worse. Denies HA , N/V, vision changes, or RUQ pain.   O:  BP 102/64   Pulse 88   Temp 98.5 F (36.9 C) (Oral)   Resp 18   Ht 5' (1.524 m)   Wt 63.9 kg (140 lb 12.8 oz)   LMP 02/27/2017   BMI 27.50 kg/m   No intake/output data recorded.  FHT:  FHR: 135 bpm, variability: moderate,  accelerations:  Present,  decelerations:  Absent UC:   regular, every 3-4 minutes SVE:   Dilation: 4 Effacement (%): 50 Station: -2 Exam by:: Wheinhold CNM Membranes still intact  Pitocin @ 5 mu/min  Labs: Lab Results  Component Value Date   WBC 10.1 12/20/2017   HGB 14.2 12/20/2017   HCT 41.4 12/20/2017   MCV 81.2 12/20/2017   PLT 235 12/20/2017    Assessment / Plan: 22 y.o. G1P0 8257w3d in early labor Induction of labor due to BPP of 6/8.,  progressing well on pitocin  Labor: Progressing on Pitocin, will continue to increase then AROM Fetal Wellbeing:  Category I Pain Control:  Labor support without medications and But will want epidural when pain gets worse Anticipated MOD:  NSVD  Expectant management   [place] PinkLexi Betancourt, MS3 12/20/17 18:41   Clayton BiblesSamantha Terrica Duecker, CNM 12/20/17 7:44 PM

## 2017-12-20 NOTE — H&P (Addendum)
Patient ID: Carrie Erickson, female   DOB: May 09, 1996, 22 y.o.   MRN: [id number] OBSTETRIC ADMISSION HISTORY AND PHYSICAL  Carrie Erickson is a 22 y.o. female G1P0 with IUP at 2423w3d by US at  presenting for IOL due to BPP of 6/8. 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 She reports +FMs, No LOF, no VB, no blurry vision, headaches or peripheral edema, and RUQ pain. She endorses feeling a contraction this morning that was about a 5/10 on pain scale but she was able to breath through it and has felt contractions occasionally throughout the morning. She plans on breast and bottle feeding. She request condoms for birth control at this time but would like to talk about other options later. [facility]   Dating: By US at 11 weeks --->  Estimated Date of Delivery: 12/17/17  Sono:    @[redacted]w[redacted]d , CWD, normal anatomy, Variable presentation   Prenatal History/Complications:  Past Medical History: Past Medical History:  Diagnosis Date  . Breast mass, right   . Hepatitis     Past Surgical History: Past Surgical History:  Procedure Laterality Date  . NO PAST SURGERIES      Obstetrical History: OB History    Gravida  1   Para      Term      Preterm      AB      Living        SAB      TAB      Ectopic      Multiple      Live Births              Social History: Social History   Socioeconomic History  . Marital status: Single    Spouse name: Not on file  . Number of children: Not on file  . Years of education: Not on file  . Highest education level: Not on file  Occupational History  . Not on file  Social Needs  . Financial resource strain: Not on file  . Food insecurity:    Worry: Not on file    Inability: Not on file  . Transportation needs:    Medical: Not on file    Non-medical: Not on file  Tobacco Use  . Smoking status: Never Smoker  . Smokeless tobacco: Never Used  Substance and Sexual Activity  . Alcohol use: No     Frequency: Never  . Drug use: No  . Sexual activity: Yes  Lifestyle  . Physical activity:    Days per week: Not on file    Minutes per session: Not on file  . Stress: Not on file  Relationships  . Social connections:    Talks on phone: Not on file    Gets together: Not on file    Attends religious service: Not on file    Active member of club or organization: Not on file    Attends meetings of clubs or organizations: Not on file    Relationship status: Not on file  Other Topics Concern  . Not on file  Social History Narrative  . Not on file    Family History: History reviewed. No pertinent family history.  Allergies: No Known Allergies  Medications Prior to Admission  Medication Sig Dispense Refill Last Dose  . Prenatal Vit-Fe Fumarate-FA (PRENATAL VITAMIN PO) Take 1 tablet by mouth daily.    12/19/2017 at Unknown time  . Promethazine HCl (PHENERGAN PO)  Take by mouth.   Past Month at Unknown time     Review of Systems   All systems reviewed and negative except as stated in HPI  Blood pressure 130/85, pulse 99, temperature 98.2 F (36.8 C), temperature source Oral, resp. rate 18, height 5' (1.524 m), weight 63.9 kg (140 lb 12.8 oz), last menstrual period 02/27/2017. General appearance: alert, cooperative and no distress Lungs: clear to auscultation bilaterally Heart: regular rate and rhythm Abdomen: soft, mild tenderness; bowel sounds normal Extremities: Homans sign is negative, no sign of DVT Presentation: Vertex Fetal monitoringBaseline: 145 bpm, Variability: Good {> 6 bpm), Accelerations: Present and Decelerations: Absent Uterine activityDate/time of onset: Morning of 12/20/17, Frequency: Occasionally and Intensity: mild Dilation: 1 Effacement (%): 50 Station: -2 Exam by:: Sam CNM   Prenatal labs: ABO, Rh: A/Positive/-- (11/26 0000) Antibody: Negative (11/26 0000) Rubella: Immune (11/26 0000) RPR: Nonreactive (11/26 0000)  HBsAg: Positive (11/26 0000)   HIV: Non-reactive (11/26 0000)  GBS: Negative (06/06 0000)  Anatomy US: Normal at 12 weeks  Prenatal Transfer Tool  Maternal Diabetes: No Genetic Screening: Declined Maternal Ultrasounds/Referrals: Normal Fetal Ultrasounds or other Referrals:  Other: Anatomy scan at 11 weeks Maternal Substance Abuse:  No Significant Maternal Medications:  None Significant Maternal Lab Results: None  Results for orders placed or performed during the hospital encounter of 12/20/17 (from the past 24 hour(s))  CBC   Collection Time: 12/20/17 10:12 AM  Result Value Ref Range   WBC 10.1 4.0 - 10.5 K/uL   RBC 5.10 3.87 - 5.11 MIL/uL   Hemoglobin 14.2 12.0 - 15.0 g/dL   HCT 16.1 09.6 - 04.5 %   MCV 81.2 78.0 - 100.0 fL   MCH 27.8 26.0 - 34.0 pg   MCHC 34.3 30.0 - 36.0 g/dL   RDW 40.9 81.1 - 91.4 %   Platelets 235 150 - 400 K/uL    Patient Active Problem List   Diagnosis Date Noted  . Supervision of normal first pregnancy, antepartum 12/20/2017  . H/O fetal biophysical profile 12/20/2017  . Hep B complicating pregnancy 12/20/2017    Assessment/Plan:  Carrie Erickson is a 22 y.o. G1P0 at [redacted]w[redacted]d here for IOL due to BPP of 6/8 with Hx of Hep B.  #Labor: Not currently in labor #Pain: Labor management without medications #FWB: Category 1 #ID:None #MOF: Breast and Bottle #MOC:Condoms but would be open to discussing other contraceptions methods later  Rolland Bimler, Medical Student  12/20/2017, 11:29 AM  RESIDENT ADDENDUM I have separately seen and examined the patient. I have discussed the findings and exam with the medical student and agree with the above note. I helped develop the management plan that is described in the student's note, and I agree with the content.   Dispo: Admit to [facility] for IOL  Clayton Bibles, [place] 12/20/17  11:59 AM

## 2017-12-20 NOTE — Anesthesia Preprocedure Evaluation (Signed)
Anesthesia Evaluation  Patient identified by MRN, date of birth, ID band Patient awake    Reviewed: Allergy & Precautions, H&P , NPO status , Patient's Chart, lab work & pertinent test results  Airway Mallampati: II   Neck ROM: full    Dental   Pulmonary neg pulmonary ROS,    breath sounds clear to auscultation       Cardiovascular negative cardio ROS   Rhythm:regular Rate:Normal     Neuro/Psych    GI/Hepatic (+) Hepatitis -, B  Endo/Other    Renal/GU      Musculoskeletal   Abdominal   Peds  Hematology   Anesthesia Other Findings   Reproductive/Obstetrics (+) Pregnancy                             Anesthesia Physical Anesthesia Plan  ASA: II  Anesthesia Plan: Epidural   Post-op Pain Management:    Induction: Intravenous  PONV Risk Score and Plan: 2 and Treatment may vary due to age or medical condition  Airway Management Planned: Natural Airway  Additional Equipment:   Intra-op Plan:   Post-operative Plan:   Informed Consent: I have reviewed the patients History and Physical, chart, labs and discussed the procedure including the risks, benefits and alternatives for the proposed anesthesia with the patient or authorized representative who has indicated his/her understanding and acceptance.     Plan Discussed with: Anesthesiologist  Anesthesia Plan Comments:         Anesthesia Quick Evaluation

## 2017-12-21 DIAGNOSIS — Z3A40 40 weeks gestation of pregnancy: Secondary | ICD-10-CM

## 2017-12-21 LAB — RPR: RPR: NONREACTIVE

## 2017-12-21 MED ORDER — DIBUCAINE 1 % RE OINT: 1.0000 "application " | TOPICAL_OINTMENT | RECTAL | Status: DC | PRN

## 2017-12-21 MED ORDER — IBUPROFEN 600 MG PO TABS
600.0000 mg | ORAL_TABLET | Freq: Four times a day (QID) | ORAL | Status: DC
  Administered 2017-12-21 – 2017-12-23 (×8): 600 mg via ORAL
  Filled 2017-12-21 (×9): qty 1

## 2017-12-21 MED ORDER — DIPHENHYDRAMINE HCL 25 MG PO CAPS: 25.0000 mg | ORAL_CAPSULE | Freq: Four times a day (QID) | ORAL | Status: DC | PRN

## 2017-12-21 MED ORDER — SIMETHICONE 80 MG PO CHEW: 80.0000 mg | CHEWABLE_TABLET | ORAL | Status: DC | PRN

## 2017-12-21 MED ORDER — TETANUS-DIPHTH-ACELL PERTUSSIS 5-2.5-18.5 LF-MCG/0.5 IM SUSP: 0.5000 mL | Freq: Once | INTRAMUSCULAR | Status: DC

## 2017-12-21 MED ORDER — ONDANSETRON HCL 4 MG PO TABS: 4.0000 mg | ORAL_TABLET | ORAL | Status: DC | PRN

## 2017-12-21 MED ORDER — PRENATAL MULTIVITAMIN CH
1.0000 | ORAL_TABLET | Freq: Every day | ORAL | Status: DC
  Administered 2017-12-21 – 2017-12-23 (×3): 1 via ORAL
  Filled 2017-12-21 (×4): qty 1

## 2017-12-21 MED ORDER — WITCH HAZEL-GLYCERIN EX PADS: 1.0000 "application " | MEDICATED_PAD | CUTANEOUS | Status: DC | PRN

## 2017-12-21 MED ORDER — COCONUT OIL OIL
1.0000 "application " | TOPICAL_OIL | Status: DC | PRN
  Administered 2017-12-23: 1 via TOPICAL
  Filled 2017-12-21: qty 120

## 2017-12-21 MED ORDER — SENNOSIDES-DOCUSATE SODIUM 8.6-50 MG PO TABS
2.0000 | ORAL_TABLET | ORAL | Status: DC
  Administered 2017-12-22 (×2): 2 via ORAL
  Filled 2017-12-21 (×2): qty 2

## 2017-12-21 MED ORDER — ZOLPIDEM TARTRATE 5 MG PO TABS: 5.0000 mg | ORAL_TABLET | Freq: Every evening | ORAL | Status: DC | PRN

## 2017-12-21 MED ORDER — ONDANSETRON HCL 4 MG/2ML IJ SOLN: 4.0000 mg | INTRAMUSCULAR | Status: DC | PRN

## 2017-12-21 MED ORDER — ACETAMINOPHEN 325 MG PO TABS: 650.0000 mg | ORAL_TABLET | ORAL | Status: DC | PRN

## 2017-12-21 MED ORDER — BENZOCAINE-MENTHOL 20-0.5 % EX AERO: 1.0000 "application " | INHALATION_SPRAY | CUTANEOUS | Status: DC | PRN

## 2017-12-21 NOTE — Progress Notes (Signed)
Labor Progress Note Lanetta Inchhinh Schoening is a 22 y.o. G1P0 at 7112w4d presented for BPP 6/8.  S: Patient resting comfortably with epidural  O:  BP 112/80   Pulse 96   Temp 98.7 F (37.1 C) (Oral)   Resp 18   Ht 5' (1.524 m)   Wt 140 lb 12.8 oz (63.9 kg)   LMP 02/27/2017   SpO2 100%   BMI 27.50 kg/m   ZOX:[id number]:baseline rate 140, moderate variability, no acels, no decels Toco: ctx every 2-3 min After AROM - early/variable  CVE: Dilation: 6 Effacement (%): 70 Cervical Position: Middle Station: -2 Presentation: Vertex Exam by:: Dr. Linwood Dibblesumball  A&P: 22 y.o. G1P0 3512[facility]4d [facility] Labor: Progressing slowly without much cervical change on Pitocin. Performed AROM.  Pain: Epidural FWB: now Cat II, continue to monitor. GBS negative  Ellwood DenseAlison Rumball, DO 12:43 AM

## 2017-12-21 NOTE — Anesthesia Postprocedure Evaluation (Signed)
Anesthesia Post Note  Patient: Carrie Erickson  Procedure(s) Performed: AN AD HOC LABOR EPIDURAL     Patient location during evaluation: Mother Baby Anesthesia Type: Epidural Level of consciousness: awake and alert and oriented Pain management: satisfactory to patient Vital Signs Assessment: post-procedure vital signs reviewed and stable Respiratory status: spontaneous breathing and nonlabored ventilation Cardiovascular status: stable Postop Assessment: no headache, no backache, no signs of nausea or vomiting, adequate PO intake and patient able to bend at knees (patient up walking) Anesthetic complications: no    Last Vitals:  Vitals:   12/21/17 0610 12/21/17 0704  BP: 109/78 136/63  Pulse: (!) 116 (!) 108  Resp: 18   Temp: 36.9 C 36.9 C  SpO2: 100%     Last Pain:  Vitals:   12/21/17 0620  TempSrc:   PainSc: 0-No pain   Pain Goal:                 Madison HickmanGREGORY,Carrie Erickson

## 2017-12-21 NOTE — Lactation Note (Signed)
This note was copied from a baby's chart. Lactation Consultation Note  Patient Name: Carrie Erickson Date: 12/21/2017 Reason for consult: Initial assessment;1st time breastfeeding;Primapara;Term;Other (Comment)  14 hours old FT female who is being partially BF and formula fed by her mother, she's a P1. [facility], but requested a refresher on hand expression. LC showed mom how to hand express but unable to get any colostrum at this time.   Mom was trying to nurse baby when entering the room, offered assistance with latch and asked visitors to step out per mom's request. Baby was very squirmy and would not latch on, she kept pushing away from the breast with both hands; and when LC and mom tried to latch her on to the right breast in football position, she'd just cry. Noticed that baby cannot extend her tongue past the gumline, LC did some suck training but she was able to suck (without biting) in a nice rhythmical pattern, but when transition to the breast, she just kept pushing away and would not latch. An attempt was documented in Flowsheets.  Mom has already started supplementation with Rush BarerGerber Gentle, reviewed formula guidelines for baby's age. She has also been using her hand pump, asked her RN to get her some coconut  Oil to protect the integrity of her nipples. Mom is Hep B (+) and she's aware that BF should be discontinue if she were to have any bleeding or trauma in the nipple/areola complex. Stressed the importance of a deep latch to avoid positional stripes, blisters, cracking, etc, etc.  Encouraged mom to feed STS baby 8-12 times/24 hours or sooner if feeding cues are present. Mom will use coconut oil prior pumping to prevent any chafing in her nipples. BF brochure, BF resources and feeding diary was reviewed, mom is aware of LC services and will call PRN.  Maternal Data Formula Feeding for Exclusion: Yes Reason for exclusion: Mother's  choice to formula and breast feed on admission Has patient been taught Hand Expression?: Yes Does the patient have breastfeeding experience prior to this delivery?: No  Feeding Feeding Type: Breast Fed   Interventions Interventions: Breast feeding basics reviewed;Assisted with latch;Skin to skin;Breast massage;Breast compression;Hand express;Adjust position;Support pillows;Position options;Coconut oil;Hand pump  Lactation Tools Discussed/Used Tools: Pump;Coconut oil Breast pump type: Manual WIC Program: Yes Pump Review: Setup, frequency, and cleaning Initiated by:: RN Date initiated:: 12/21/17   Consult Status Consult Status: Follow-up Date: 12/22/17 Follow-up type: In-patient    Carrie Erickson 12/21/2017, 6:17 PM

## 2017-12-22 NOTE — Progress Notes (Signed)
Post Partum Day 1 Subjective: no complaints, up ad lib, voiding and tolerating PO  Objective: Blood pressure 106/72, pulse 96, temperature 98.5 F (36.9 C), temperature source Oral, resp. rate 16, height 5' (1.524 m), weight 140 lb 12.8 oz (63.9 kg), last menstrual period 02/27/2017, SpO2 100 %.  Physical Exam:  General: alert, cooperative, appears stated age and no distress Lochia: appropriate Uterine Fundus: firm Incision: n/a DVT Evaluation: No evidence of DVT seen on physical exam.  Recent Labs    12/20/17 1012  HGB 14.2  HCT 41.4    Assessment/Plan: Plan for discharge tomorrow   LOS: 2 days   Wyvonnia DuskyMarie Lawson 12/22/2017, 8:47 AM

## 2017-12-22 NOTE — Lactation Note (Signed)
This note was copied from a baby's chart. Lactation Consultation Note  Patient Name: Girl Lanetta Inchhinh Zinger WUJWJ'XToday's Date: 12/22/2017   Visited with P1 Mom of term baby at 2535 hrs old, and 4% weight loss.  Mom started giving formula by bottles yesterday, per her choice.  Mom continues to offer formula after baby breastfeeds, up to 33 ml today.  Talked to Mom about formula feedings, can cause baby to cue to breastfeed less often, and to feed for shorter durations.  Shared with Mom and FOB about importance of exclusive breastfeeding.  Mom states she is able to hand express colostrum much easier, and more volume.    Encouraged STS, and feeding baby often on cue.  Goal of feedings are 8-12/per 24 hrs.  Mom nodded she knew.  Baby sleeping on Mom's chest clothed.  Reminded her of STS. Encouraged to call prn for assistance.   To follow-up 12/23/17 inpatient Judee ClaraSmith, Tyla Burgner E 12/22/2017, 3:37 PM

## 2017-12-23 ENCOUNTER — Encounter (HOSPITAL_COMMUNITY): Payer: Self-pay | Admitting: *Deleted

## 2017-12-23 MED ORDER — IBUPROFEN 600 MG PO TABS: 600.0000 mg | ORAL_TABLET | Freq: Four times a day (QID) | ORAL | 0 refills | Status: DC

## 2017-12-23 NOTE — Lactation Note (Signed)
This note was copied from a baby's chart. Lactation Consultation Note  Patient Name: Carrie Erickson ZOXWR'UToday's Date: 12/23/2017   [facility]  Mother sleeping; will attempt to return later this a.m.                    [name] R Damarkus Balis 12/23/2017, 4:46 AM

## 2017-12-23 NOTE — Lactation Note (Signed)
This note was copied from a baby's chart. Lactation Consultation Note  Patient Name: Carrie Lanetta Inchhinh Kauk WUJWJ'XToday's Date: 12/23/2017 Reason for consult: Follow-up assessment   Baby 55 hours old.  Hep B+ and mother is aware of precautions. Mother has been primarily formula feeding but states she is breastfeeding on the L side because her R nipple is sore. Provided mother w/ coconut oil.  Mother has manual pump in room but states it hurts to pump. Increased flanges to #27 and lubricated w/ coconut oil and mother states it feels more comfortable. Mother inquired about a nipple shield. Suggest she call when baby cues to fit NS. Mom encouraged to feed baby 8-12 times/24 hours and with feeding cues.  Reviewed engorgement care and monitoring voids/stools. Discussed supply and demand.     Maternal Data    Feeding Feeding Type: Bottle Fed - Formula  LATCH Score                   Interventions Interventions: Hand pump  Lactation Tools Discussed/Used     Consult Status Consult Status: Follow-up Date: 12/24/17 Follow-up type: In-patient    Dahlia ByesBerkelhammer, Tonilynn Bieker [facility] 12/23/2017, 11:42 AM

## 2017-12-23 NOTE — Discharge Instructions (Signed)
Vaginal Delivery, Care After °Refer to this sheet in the next few weeks. These instructions provide you with information about caring for yourself after vaginal delivery. Your health care provider may also give you more specific instructions. Your treatment has been planned according to current medical practices, but problems sometimes occur. Call your health care provider if you have any problems or questions. °What can I expect after the procedure? °After vaginal delivery, it is common to have: °· Some bleeding from your vagina. °· Soreness in your abdomen, your vagina, and the area of skin between your vaginal opening and your anus (perineum). °· Pelvic cramps. °· Fatigue. ° °Follow these instructions at home: °Medicines °· Take over-the-counter and prescription medicines only as told by your health care provider. °· If you were prescribed an antibiotic medicine, take it as told by your health care provider. Do not stop taking the antibiotic until it is finished. °Driving ° °· Do not drive or operate heavy machinery while taking prescription pain medicine. °· Do not drive for 24 hours if you received a sedative. °Lifestyle °· Do not drink alcohol. This is especially important if you are breastfeeding or taking medicine to relieve pain. °· Do not use tobacco products, including cigarettes, chewing tobacco, or e-cigarettes. If you need help quitting, ask your health care provider. °Eating and drinking °· Drink at least 8 eight-ounce glasses of water every day unless you are told not to by your health care provider. If you choose to breastfeed your baby, you may need to drink more water than this. °· Eat high-fiber foods every day. These foods may help prevent or relieve constipation. High-fiber foods include: °? Whole grain cereals and breads. °? Brown rice. °? Beans. °? Fresh fruits and vegetables. °Activity °· Return to your normal activities as told by your health care provider. Ask your health care provider  what activities are safe for you. °· Rest as much as possible. Try to rest or take a nap when your baby is sleeping. °· Do not lift anything that is heavier than your baby or 10 lb (4.5 kg) until your health care provider says that it is safe. °· Talk with your health care provider about when you can engage in sexual activity. This may depend on your: °? Risk of infection. °? Rate of healing. °? Comfort and desire to engage in sexual activity. °Vaginal Care °· If you have an episiotomy or a vaginal tear, check the area every day for signs of infection. Check for: °? More redness, swelling, or pain. °? More fluid or blood. °? Warmth. °? Pus or a bad smell. °· Do not use tampons or douches until your health care provider says this is safe. °· Watch for any blood clots that may pass from your vagina. These may look like clumps of dark red, brown, or black discharge. °General instructions °· Keep your perineum clean and dry as told by your health care provider. °· Wear loose, comfortable clothing. °· Wipe from front to back when you use the toilet. °· Ask your health care provider if you can shower or take a bath. If you had an episiotomy or a perineal tear during labor and delivery, your health care provider may tell you not to take baths for a certain length of time. °· Wear a bra that supports your breasts and fits you well. °· If possible, have someone help you with household activities and help care for your baby for at least a few days after   you leave the hospital. °· Keep all follow-up visits for you and your baby as told by your health care provider. This is important. °Contact a health care provider if: °· You have: °? Vaginal discharge that has a bad smell. °? Difficulty urinating. °? Pain when urinating. °? A sudden increase or decrease in the frequency of your bowel movements. °? More redness, swelling, or pain around your episiotomy or vaginal tear. °? More fluid or blood coming from your episiotomy or  vaginal tear. °? Pus or a bad smell coming from your episiotomy or vaginal tear. °? A fever. °? A rash. °? Little or no interest in activities you used to enjoy. °? Questions about caring for yourself or your baby. °· Your episiotomy or vaginal tear feels warm to the touch. °· Your episiotomy or vaginal tear is separating or does not appear to be healing. °· Your breasts are painful, hard, or turn red. °· You feel unusually sad or worried. °· You feel nauseous or you vomit. °· You pass large blood clots from your vagina. If you pass a blood clot from your vagina, save it to show to your health care provider. Do not flush blood clots down the toilet without having your health care provider look at them. °· You urinate more than usual. °· You are dizzy or light-headed. °· You have not breastfed at all and you have not had a menstrual period for 12 weeks after delivery. °· You have stopped breastfeeding and you have not had a menstrual period for 12 weeks after you stopped breastfeeding. °Get help right away if: °· You have: °? Pain that does not go away or does not get better with medicine. °? Chest pain. °? Difficulty breathing. °? Blurred vision or spots in your vision. °? Thoughts about hurting yourself or your baby. °· You develop pain in your abdomen or in one of your legs. °· You develop a severe headache. °· You faint. °· You bleed from your vagina so much that you fill two sanitary pads in one hour. °This information is not intended to replace advice given to you by your health care provider. Make sure you discuss any questions you have with your health care provider. °Document Released: 06/01/2000 Document Revised: 11/16/2015 Document Reviewed: 06/19/2015 °[employer] Interactive Patient Education © 2018 [employer] ° °

## 2017-12-23 NOTE — Discharge Summary (Addendum)
OB Discharge Summary    Patient Name: Carrie Erickson DOB: [birth year] MRN: [id number]  Date of admission: 12/20/2017 Delivering MD: Ellwood Dense   Date of discharge: 12/23/2017  Admitting diagnosis: INDUCTION Intrauterine pregnancy: [redacted]w[redacted]d     Secondary diagnosis:  Principal Problem:   SVD (spontaneous vaginal delivery) Active Problems:   Supervision of normal first pregnancy, antepartum   H/O fetal biophysical profile   Hep B complicating pregnancy  Additional problems: N/A     Discharge diagnosis: Term Pregnancy Delivered                                                                                                Post partum procedures:None  Augmentation: AROM, Pitocin and Foley Balloon  Complications: None  Hospital course:  Induction of Labor With Vaginal Delivery   22 y.o. yo G1P0 at [redacted]w[redacted]d was admitted to the hospital 12/20/2017 for induction of labor.  Indication for induction: BPP 6/8.  Patient had an uncomplicated labor course as follows: Membrane Rupture Time/Date: 12:24 AM ,12/21/2017   Intrapartum Procedures: Episiotomy: None [1]                                         Lacerations:  2nd degree [3];Vaginal [6];Labial [10]  Patient had delivery of a Viable infant.  Information for the patient's newborn:  Carrie Erickson [[id number]]  Delivery Method: Vag-Spont   12/21/2017  Details of delivery can be found in separate delivery note.  Patient had a routine postpartum course. Patient is discharged home 12/23/17.  Physical exam  Vitals:   12/21/17 1105 12/21/17 2201 12/22/17 2233 12/23/17 0500  BP: 102/70 106/72 113/78 109/79  Pulse: 93 96 89 91  Resp: 18 16 (!) 22 19  Temp: 98.3 F (36.8 C) 98.5 F (36.9 C) 98.4 F (36.9 C) 98.3 F (36.8 C)  TempSrc: Oral Oral Oral Oral  SpO2:  100%    Weight:      Height:       General: alert, cooperative and no distress Lochia: appropriate Uterine Fundus: firm Incision: N/A DVT Evaluation: No evidence of DVT seen on  physical exam. Labs: Lab Results  Component Value Date   WBC 10.1 12/20/2017   HGB 14.2 12/20/2017   HCT 41.4 12/20/2017   MCV 81.2 12/20/2017   PLT 235 12/20/2017   CMP Latest Ref Rng & Units 04/29/2017  Glucose 65 - 99 mg/dL 952(W)  BUN 6 - 20 mg/dL 9  Creatinine 4.13 - 2.44 mg/dL 0.10  Sodium 272 - 536 mmol/L 137  Potassium 3.5 - 5.1 mmol/L 3.6  Chloride 101 - 111 mmol/L 101  CO2 22 - 32 mmol/L -  Calcium 8.9 - 10.3 mg/dL -  Total Protein 6.5 - 8.1 g/dL -  Total Bilirubin 0.3 - 1.2 mg/dL -  Alkaline Phos 38 - 644 U/L -  AST 15 - 41 U/L -  ALT 14 - 54 U/L -    Discharge instruction: per After Visit Summary and "Baby and Me Booklet".  After visit meds:  Allergies as of 12/23/2017   No Known Allergies     Medication List    STOP taking these medications   PHENERGAN PO     TAKE these medications   ibuprofen 600 MG tablet Commonly known as:  ADVIL,MOTRIN Take 1 tablet (600 mg total) by mouth every 6 (six) hours.   PRENATAL VITAMIN PO Take 1 tablet by mouth daily.       Diet: routine diet  Activity: Advance as tolerated. Pelvic rest for 6 weeks.   Outpatient follow up:6 weeks Follow up Appt:No future appointments. Follow up Visit:No follow-ups on file.  Postpartum contraception: Condoms  Newborn Data: Live born female  Birth Weight: 7 lb 1.9 oz (3230 g) APGAR: 9, 9  Newborn Delivery   Birth date/time:  12/21/2017 04:12:00 Delivery type:  Vaginal, Spontaneous     Baby Feeding: Breast Disposition:home with mother  12/23/2017 Ellwood DenseAlison Rumball, DO  OB FELLOW DISCHARGE ATTESTATION  I have seen and examined this patient and agree with above documentation in the resident's note.   Frederik PearJulie P Degele, MD OB Fellow

## 2017-12-24 ENCOUNTER — Inpatient Hospital Stay (HOSPITAL_COMMUNITY): Admission: RE | Admit: 2017-12-24 | Payer: MEDICAID | Source: Ambulatory Visit

## 2018-02-04 DIAGNOSIS — Z3049 Encounter for surveillance of other contraceptives: Secondary | ICD-10-CM | POA: Diagnosis not present

## 2018-02-20 DIAGNOSIS — Z23 Encounter for immunization: Secondary | ICD-10-CM | POA: Diagnosis not present

## 2018-07-13 ENCOUNTER — Encounter (HOSPITAL_COMMUNITY): Payer: Self-pay | Admitting: Emergency Medicine

## 2018-07-13 ENCOUNTER — Other Ambulatory Visit: Payer: Self-pay

## 2018-07-13 ENCOUNTER — Emergency Department (HOSPITAL_COMMUNITY): Payer: Medicaid Other

## 2018-07-13 ENCOUNTER — Emergency Department (HOSPITAL_COMMUNITY)
Admission: EM | Admit: 2018-07-13 | Discharge: 2018-07-13 | Disposition: A | Discharge status: Home / Self Care | Payer: Medicaid Other | Attending: Emergency Medicine | Admitting: Emergency Medicine

## 2018-07-13 DIAGNOSIS — R509 Fever, unspecified: Secondary | ICD-10-CM | POA: Diagnosis not present

## 2018-07-13 DIAGNOSIS — R05 Cough: Secondary | ICD-10-CM | POA: Diagnosis not present

## 2018-07-13 DIAGNOSIS — R69 Illness, unspecified: Secondary | ICD-10-CM

## 2018-07-13 DIAGNOSIS — J111 Influenza due to unidentified influenza virus with other respiratory manifestations: Secondary | ICD-10-CM | POA: Insufficient documentation

## 2018-07-13 DIAGNOSIS — Z79899 Other long term (current) drug therapy: Secondary | ICD-10-CM | POA: Insufficient documentation

## 2018-07-13 DIAGNOSIS — J029 Acute pharyngitis, unspecified: Secondary | ICD-10-CM | POA: Diagnosis not present

## 2018-07-13 LAB — I-STAT BETA HCG BLOOD, ED (MC, WL, AP ONLY)

## 2018-07-13 LAB — POC URINE PREG, ED: Preg Test, Ur: NEGATIVE

## 2018-07-13 LAB — GROUP A STREP BY PCR: GROUP A STREP BY PCR: NOT DETECTED

## 2018-07-13 MED ORDER — DIPHENHYDRAMINE HCL 50 MG/ML IJ SOLN
12.5000 mg | Freq: Once | INTRAMUSCULAR | Status: AC
  Administered 2018-07-13: 12.5 mg via INTRAVENOUS
  Filled 2018-07-13: qty 1

## 2018-07-13 MED ORDER — ACETAMINOPHEN 325 MG PO TABS
650.0000 mg | ORAL_TABLET | Freq: Once | ORAL | Status: AC
  Administered 2018-07-13: 650 mg via ORAL
  Filled 2018-07-13: qty 2

## 2018-07-13 MED ORDER — SODIUM CHLORIDE 0.9 % IV BOLUS
1000.0000 mL | Freq: Once | INTRAVENOUS | Status: AC
  Administered 2018-07-13: 1000 mL via INTRAVENOUS

## 2018-07-13 MED ORDER — OSELTAMIVIR PHOSPHATE 75 MG PO CAPS: 75.0000 mg | ORAL_CAPSULE | Freq: Two times a day (BID) | ORAL | 0 refills | Status: DC

## 2018-07-13 MED ORDER — KETOROLAC TROMETHAMINE 15 MG/ML IJ SOLN
15.0000 mg | Freq: Once | INTRAMUSCULAR | Status: AC
  Administered 2018-07-13: 15 mg via INTRAVENOUS
  Filled 2018-07-13: qty 1

## 2018-07-13 NOTE — Discharge Instructions (Signed)
Evaluated today for flu-like illness.  I prescribed Tamiflu.  Please take as prescribed.  If you develop severe headache, nausea, vomiting please stop taking Tamiflu.  Follow-up with PCP for reevaluation for continued symptoms over the next 3 to 4 days.  Return to the ED for any worsening symptoms.

## 2018-07-13 NOTE — ED Triage Notes (Signed)
Pt. Stated, ive had sore throat, cough and headache for 2 days.I took Tylenol last night.

## 2018-07-13 NOTE — ED Provider Notes (Signed)
[facility]   CSN: [id number] Arrival date & time: 07/13/18  0746  History   Chief Complaint Chief Complaint  Patient presents with  . Headache  . Cough  . Sore Throat  . Fever    HPI Carrie Erickson is a 23 y.o. female with past medical story who presents for evaluation of flu-like illness.  Patient states she has had a fever, temp 102.0 max at home, headache, cough, sore throat as well as congestion and rhinorrhea x36 hours.  Patient states symptoms started suddenly.  Patient has taken Tylenol 12 hours PTA.  Has been able to tolerate p.o. intake without difficulty. Patient states cough is productive of light yellow sputum.  Patient unsure if she received influenza vaccine.  Patient states she has had generalized weakness as well as allover body aches and pains from symptom onset. Denies chills, neck pain, neck stiffness, chest pain, SOB, nausea, vomiting, abdominal pain, diarrhea, dysuria, back pain.  Denies additional aggravating or relieving factors.  History obtained from patient and significant other.  No interpreter was used.  Patient and family declined need for medical interpreter.  HPI  Past Medical History:  Diagnosis Date  . Breast mass, right   . Hepatitis     Patient Active Problem List   Diagnosis Date Noted  . SVD (spontaneous vaginal delivery) 12/22/2017  . Supervision of normal first pregnancy, antepartum 12/20/2017  . H/O fetal biophysical profile 12/20/2017  . Hep B complicating pregnancy 12/20/2017    Past Surgical History:  Procedure Laterality Date  . NO PAST SURGERIES       OB History    Gravida  1   Para      Term      Preterm      AB      Living        SAB      TAB      Ectopic      Multiple      Live Births               Home Medications    Prior to Admission medications   Medication Sig Start Date End Date Taking? Authorizing Provider  ibuprofen (ADVIL,MOTRIN) 600 MG  tablet Take 1 tablet (600 mg total) by mouth every 6 (six) hours. 12/23/17   Ellwood Denseumball, Alison, DO  oseltamivir (TAMIFLU) 75 MG capsule Take 1 capsule (75 mg total) by mouth every 12 (twelve) hours. 07/13/18   Annice Jolly A, PA-C  Prenatal Vit-Fe Fumarate-FA (PRENATAL VITAMIN PO) Take 1 tablet by mouth daily.     [provider]    Family History No family history on file.  Social History Social History   Tobacco Use  . Smoking status: Never Smoker  . Smokeless tobacco: Never Used  Substance Use Topics  . Alcohol use: No    Frequency: Never  . Drug use: No     Allergies   Patient has no known allergies.   Review of Systems Review of Systems  Constitutional: Positive for fever.  HENT: Positive for congestion, postnasal drip, rhinorrhea, sinus pressure, sneezing and sore throat. Negative for sinus pain, tinnitus, trouble swallowing and voice change.   Eyes: Negative.   Respiratory: Positive for cough. Negative for apnea, choking, chest tightness, shortness of breath, wheezing and stridor.   Cardiovascular: Negative.   Gastrointestinal: Negative.   Genitourinary: Negative.   Musculoskeletal: Negative.   Skin: Negative.   Neurological: Negative.   All other  systems reviewed and are negative.    Physical Exam Updated Vital Signs BP (!) 98/69 (BP Location: Left Arm)   Pulse (!) 111   Temp 98.2 F (37.3 C) (Oral)   Resp 17   Ht 5\' 1"  (1.549 m)   Wt 63.5 kg   SpO2 100%   BMI 26.45 kg/m   Physical Exam Vitals signs and nursing note reviewed.  Constitutional:      General: She is not in acute distress.    Appearance: She is well-developed. She is not toxic-appearing or diaphoretic.  HENT:     Head: Normocephalic and atraumatic.     Jaw: There is normal jaw occlusion.     Right Ear: Tympanic membrane and ear canal normal. No drainage, swelling or tenderness. No middle ear effusion. Tympanic membrane is not erythematous.     Left Ear: Tympanic membrane and  ear canal normal. No drainage, swelling or tenderness.  No middle ear effusion. Tympanic membrane is not erythematous.     Nose: Congestion and rhinorrhea present.     Comments: Clear rhinorrhea to bilateral nares.    Mouth/Throat:     Tonsils: No tonsillar exudate or tonsillar abscesses. Swelling: 0 on the right. 0 on the left.     Comments: Oropharynx clear without erythema or exudate.  Tonsils without edema or exudate.  Uvula midline without deviation.  Mucous membranes dry. Eyes:     Conjunctiva/sclera: Conjunctivae normal.     Pupils: Pupils are equal, round, and reactive to light.     Comments: No horizontal, vertical or rotational nystagmus   Neck:     Musculoskeletal: Full passive range of motion without pain, normal range of motion and neck supple. Normal range of motion. No edema, erythema, neck rigidity, crepitus, injury, pain with movement, torticollis or muscular tenderness.     Trachea: Trachea and phonation normal.     Meningeal: Brudzinski's sign and Kernig's sign absent.     Comments: No neck stiffness or neck rigidity. No meningismus. No cervical lymphadenopathy.Full active and passive ROM without pain No midline or paraspinal tenderness No nuchal rigidity or meningeal signs  Cardiovascular:     Rate and Rhythm: Tachycardia present.     Pulses: Normal pulses.     Heart sounds: Normal heart sounds. No murmur. No friction rub. No gallop.   Pulmonary:     Effort: Pulmonary effort is normal. No respiratory distress.     Breath sounds: Normal breath sounds and air entry.     Comments: Clear to auscultation bilaterally without wheeze, rhonchi or rales.  No accessory muscle usage.  No tachypnea.  Able to speak in full sentences without difficulty. Abdominal:     General: There is no distension.     Palpations: Abdomen is soft.     Tenderness: There is no abdominal tenderness. There is no right CVA tenderness, left CVA tenderness, guarding or rebound.     Hernia: No hernia is  present.     Comments: Soft, Nontender without rebound or guarding.  No CVA tenderness.  Musculoskeletal: Normal range of motion.     Comments: Moves all extremities without difficulty.  Ambulatory in department that difficulty.  Lymphadenopathy:     Cervical: No cervical adenopathy.  Skin:    General: Skin is warm and dry.     Comments: No rashes or lesions.  Neurological:     Mental Status: She is alert.     Comments: Mental Status:  Alert, oriented, thought content appropriate. Speech fluent without evidence  of aphasia. Able to follow 2 step commands without difficulty.  Cranial Nerves:  II:  Peripheral visual fields grossly normal, pupils equal, round, reactive to light III,IV, VI: ptosis not present, extra-ocular motions intact bilaterally  V,VII: smile symmetric, facial light touch sensation equal VIII: hearing grossly normal bilaterally  IX,X: midline uvula rise  XI: bilateral shoulder shrug equal and strong XII: midline tongue extension  Motor:  5/5 in upper and lower extremities bilaterally including strong and equal grip strength and dorsiflexion/plantar flexion Sensory: Pinprick and light touch normal in all extremities.  Deep Tendon Reflexes: 2+ and symmetric  Cerebellar: normal finger-to-nose with bilateral upper extremities Gait: normal gait and balance CV: distal pulses palpable throughout       ED Treatments / Results  Labs (all labs ordered are listed, but only abnormal results are displayed) Labs Reviewed  GROUP A STREP BY PCR  I-STAT BETA HCG BLOOD, ED ([facility], [facility], [facility] ONLY)  POC URINE PREG, ED    EKG None  Radiology Dg Chest 2 View  Result Date: 07/13/2018 CLINICAL DATA:  Cough and fever for 2 days, weakness and sore throat. EXAM: CHEST - 2 VIEW COMPARISON:  None. FINDINGS: The heart size and mediastinal contours are within normal limits. Both lungs are clear. The visualized skeletal structures are unremarkable. IMPRESSION: No active cardiopulmonary  disease. No evidence of pneumonia or pulmonary edema. Electronically Signed   By: Bary RichardStan  Maynard M.D.   On: 07/13/2018 08:36    Procedures Procedures (including critical care time)  Medications Ordered in ED Medications  acetaminophen (TYLENOL) tablet 650 mg (650 mg Oral Given 07/13/18 0756)  sodium chloride 0.9 % bolus 1,000 mL (0 mLs Intravenous Stopped 07/13/18 1241)  ketorolac (TORADOL) 15 MG/ML injection 15 mg (15 mg Intravenous Given 07/13/18 1024)  diphenhydrAMINE (BENADRYL) injection 12.5 mg (12.5 mg Intravenous Given 07/13/18 1024)    Initial Impression / Assessment and Plan / ED Course  I have reviewed the triage vital signs and the nursing notes.  Pertinent labs & imaging results that were available during my care of the patient were reviewed by me and considered in my medical decision making (see chart for details).  23 year old female who presents for evaluation of flu like illness. Onset 36 hours ago.  Unsure if received flu vaccine. Febrile at home, temp 102 max.  Nonseptic appearing.  Presents for sore throat, headache, productive cough, rhinorrhea, nasal congestion. Able to tolerate p.o. intake at home without difficulty.  Chest x-ray negative for infiltrates, pneumothorax or pulmonary edema.  Lungs clear to auscultation bilateral without wheeze, rhonchi or rales.  She is tachycardic on initial evaluation and febrile. Will give Tylenol as well as fluids.  Strep test negative.  Posterior pharynx clear. Headache with nonfocal neurologic exam without neurologic deficits.  No neck stiffness or neck rigidity, no meningismus, low suspicion for meningitis. Likely influenza given symptoms.  On reevaluation patient states she feels improvement with fluids and headache medication. Headache resolved. Presentation non concerning for [facility], ICH, Meningitis, or temporal arteritis. HCG negative. Patient sitting in bed on reevaluation with.  Able to tolerate p.o. intake without difficulty prior to  discharge.  Fever resolved with Tylenol.  Family requesting DC home at this time.  States she feels much improvement. Low suspicion for emergent pathology causing patient's symptoms at this time that would require inpatient management or further work-up in emergency department.  Strict return precautions with patient and family.  Patient hemodynamically stable and appropriate for DC home at this time.  Patient  and family voiced understanding of return precautions and are agreeable for follow-up.    Final Clinical Impressions(s) / ED Diagnoses   Final diagnoses:  Influenza-like illness    ED Discharge Orders         Ordered    oseltamivir (TAMIFLU) 75 MG capsule  Every 12 hours     07/13/18 1223           Jaquay Posthumus A, PA-C 07/13/18 1513    Azalia Bilis, MD 07/13/18 1631

## 2019-01-06 IMAGING — US US MFM OB COMPLETE +14 WKS
1 series · 15 of 26 positions shown · non-contrast
Comparison: none

[Series 1: us mfm ob complete +14 wks · 15 of 26 slices shown]
[im 1/26]
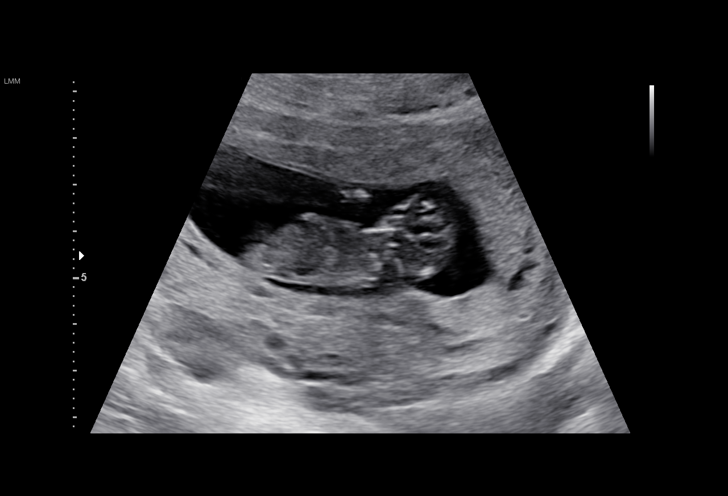
[im 3/26]
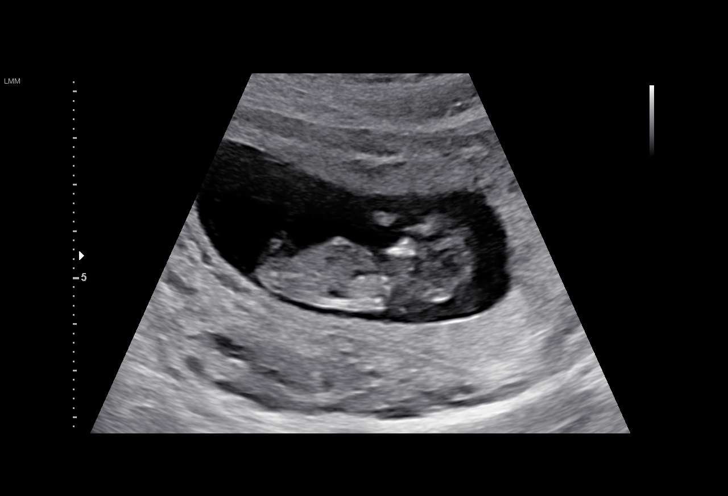
[im 5/26]
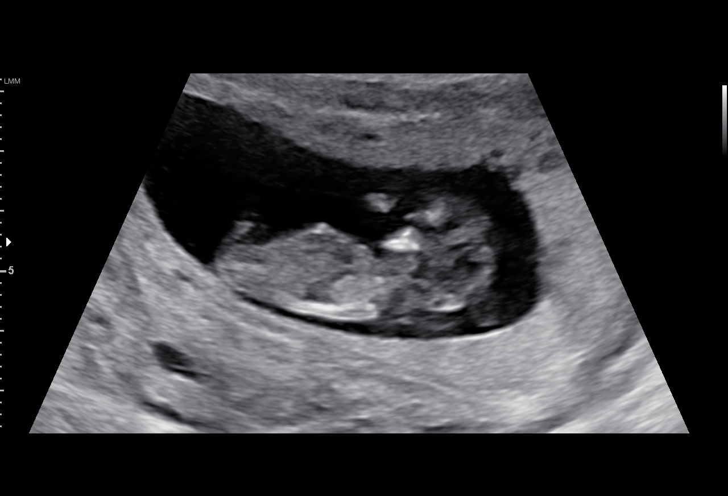
[im 7/26]
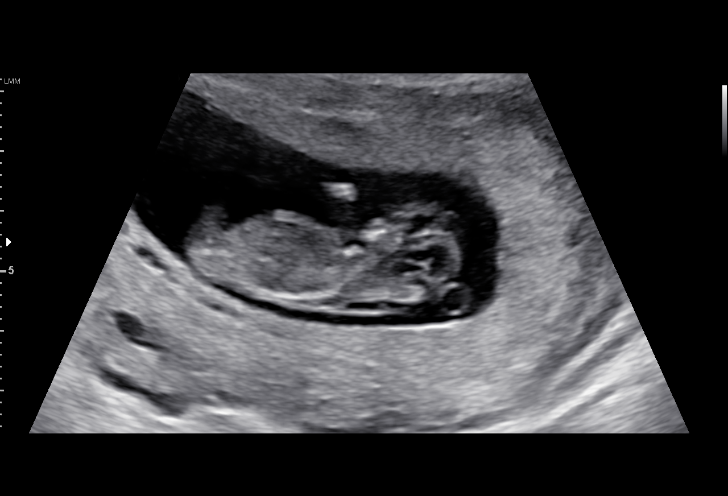
[im 8/26]
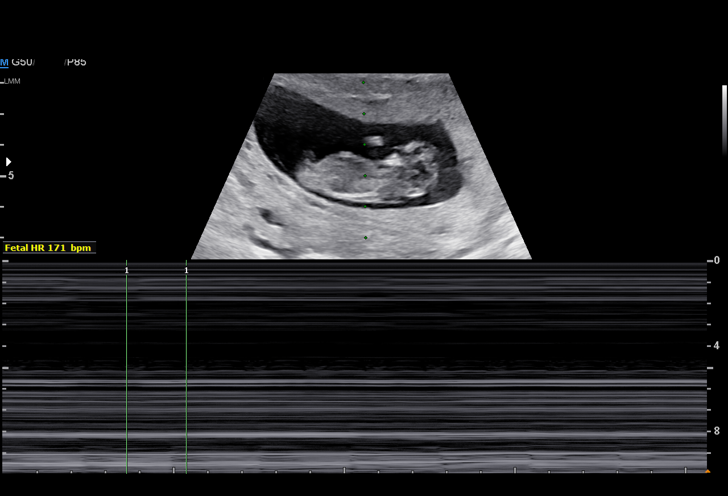
[im 10/26]
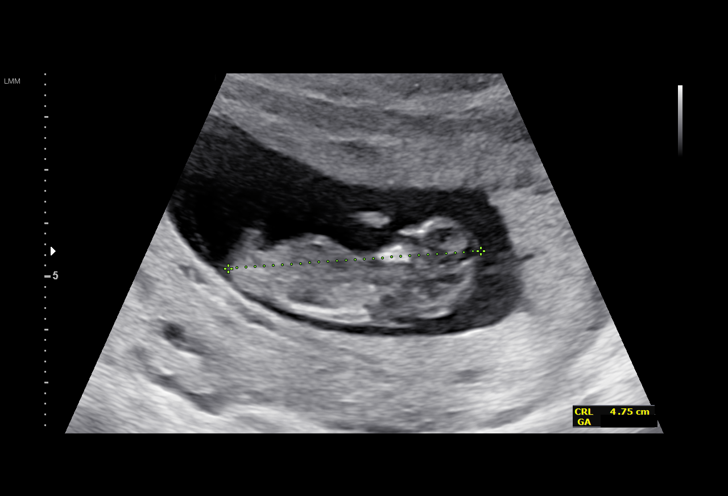
[im 12/26]
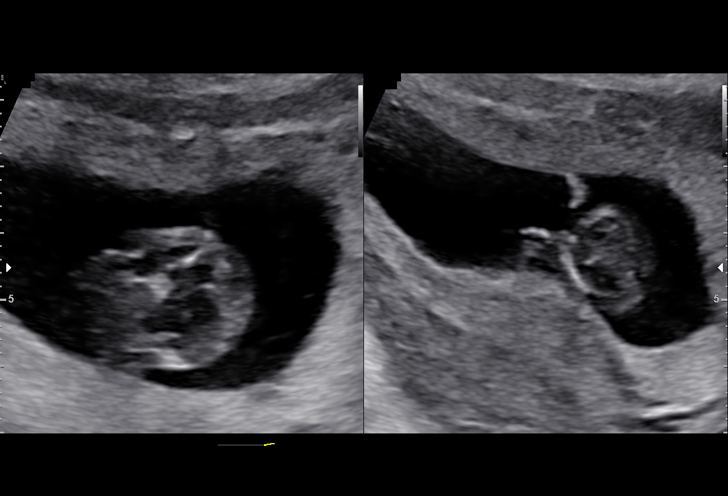
[im 14/26]
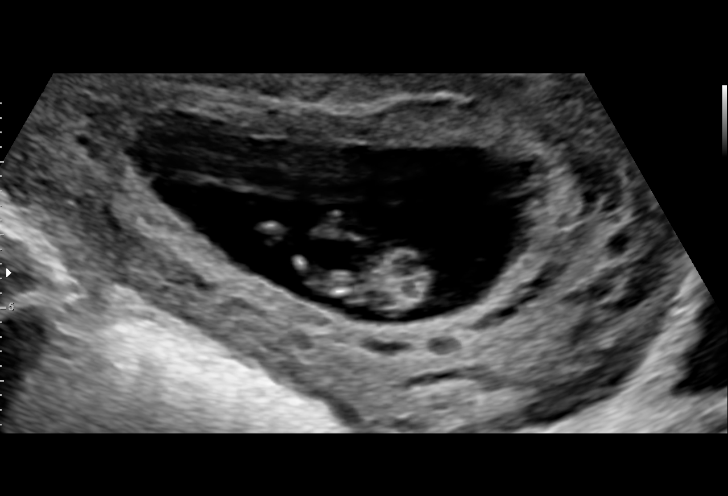
[im 15/26]
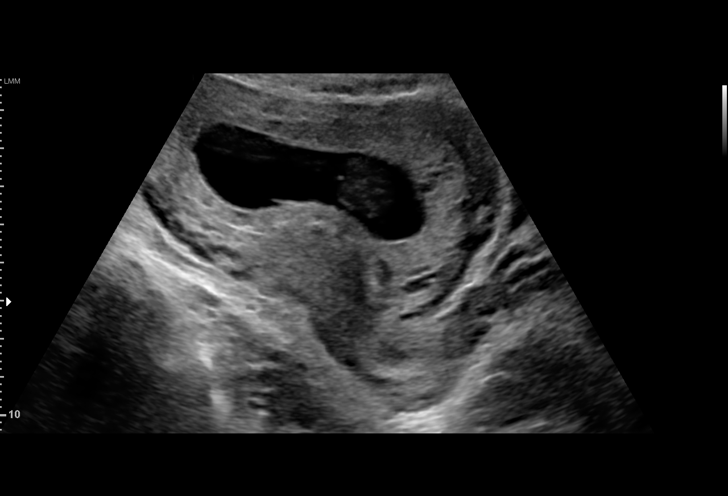
[im 17/26]
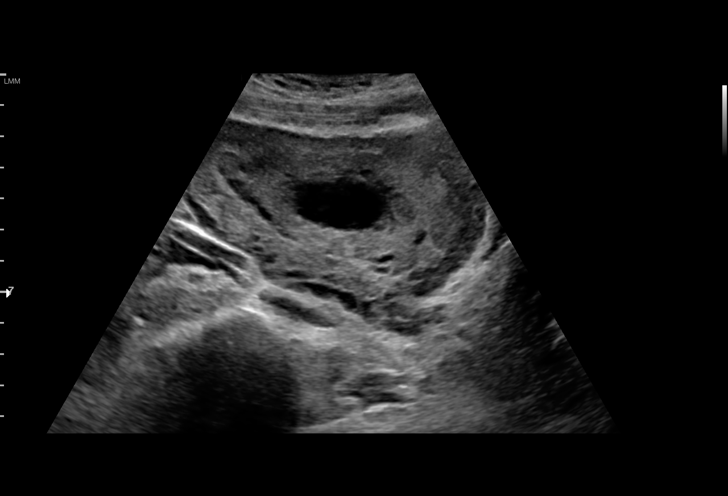
[im 19/26]
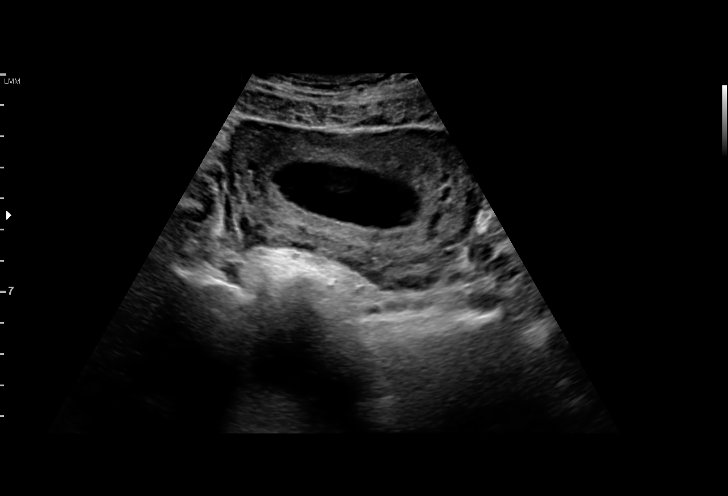
[im 20/26]
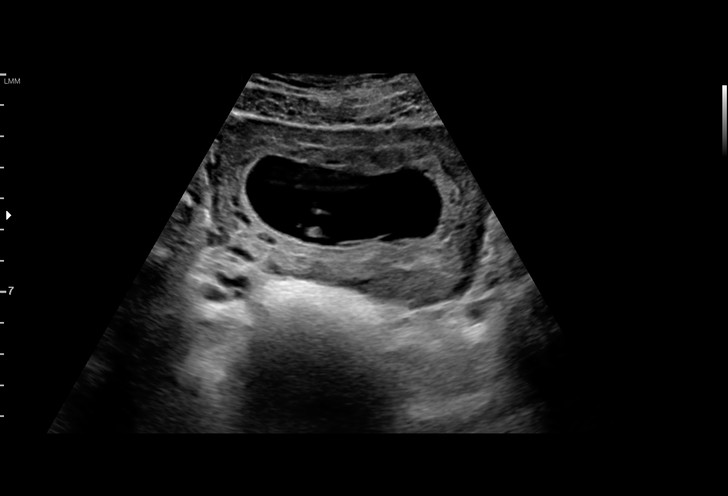
[im 22/26]
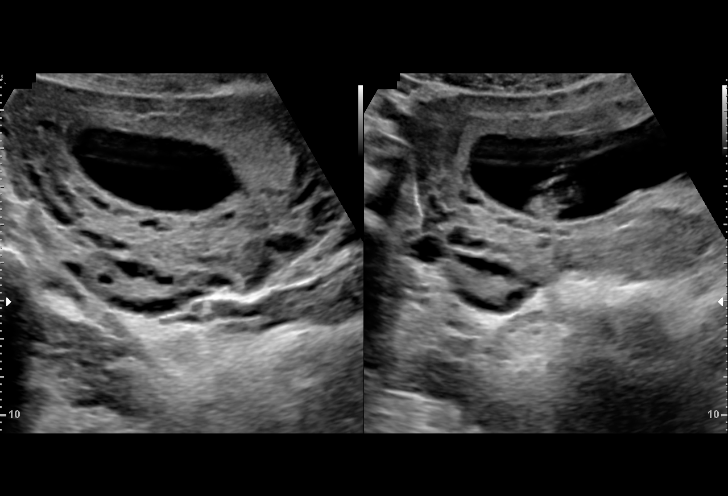
[im 24/26]
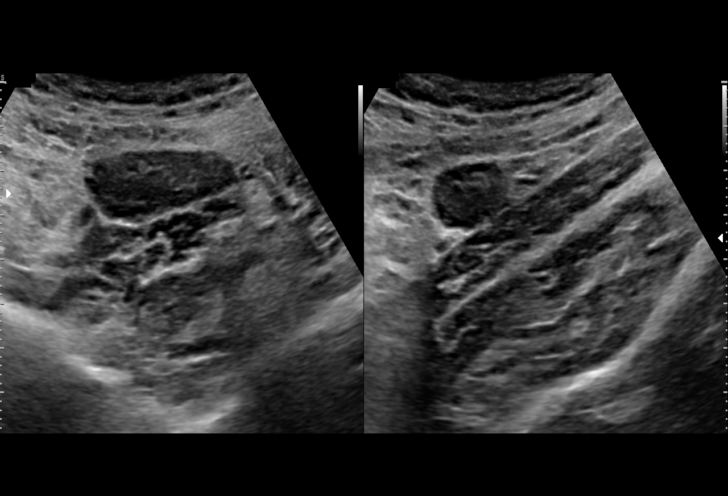
[im 26/26]
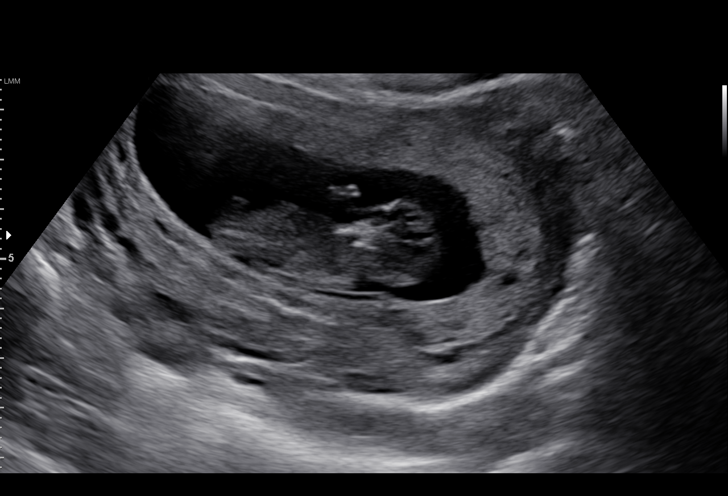

[15 of 26 positions shown; findings below may reference images not displayed]

[REDACTED]-
Faculty Physician

WEEKS

1  TAEHYI KONEV            [id number]      [id number]     [id number]
Indications

11 weeks gestation of pregnancy
Encounter for uncertain dates
OB History

Gravidity:    1
Fetal Evaluation

Num Of Fetuses:     1
Fetal Heart         171
Rate(bpm):
Cardiac Activity:   Observed
Presentation:       Variable

Amniotic Fluid
AFI FV:      Subjectively within normal limits
Biometry

CRL:      47.4  mm     G. Age:  11w 3d                  EDD:   12/17/17
Gestational Age

LMP:           13w 2d        Date:  02/27/17                 EDD:   12/04/17
Best:          11w 3d     Det. By:  U/S C R L (05/31/17)     EDD:   12/17/17
Cervix Uterus Adnexa
Cervix
Normal appearance by transabdominal scan.

Uterus
No abnormality visualized.

Left Ovary
Within normal limits.

Right Ovary
Within normal limits.

Cul De Sac:   No free fluid seen.

Adnexa:       No abnormality visualized.
Impression

IUP at 13+2 weeks, here for first trimester testing
Normal fetal cardiac activity
Normal amniotic fluid
CRL shows EGA of 11+3 weeks
Recommendations

Recommend using EDC of 12/17/2017
Unable to do NT measurements today
Will reschedule in 7-10 days

## 2019-09-09 ENCOUNTER — Ambulatory Visit: Payer: Medicaid Other

## 2019-09-10 DIAGNOSIS — Z03818 Encounter for observation for suspected exposure to other biological agents ruled out: Secondary | ICD-10-CM | POA: Diagnosis not present

## 2019-09-10 DIAGNOSIS — Z20828 Contact with and (suspected) exposure to other viral communicable diseases: Secondary | ICD-10-CM | POA: Diagnosis not present

## 2019-09-10 DIAGNOSIS — U071 COVID-19: Secondary | ICD-10-CM | POA: Diagnosis not present

## 2020-02-18 IMAGING — CR DG CHEST 2V
2 series · 2 of 2 positions shown · non-contrast
Comparison: None.

CLINICAL DATA: Cough and fever for 2 days, weakness and sore
throat.

EXAM:
CHEST - 2 VIEW

[chest pa]
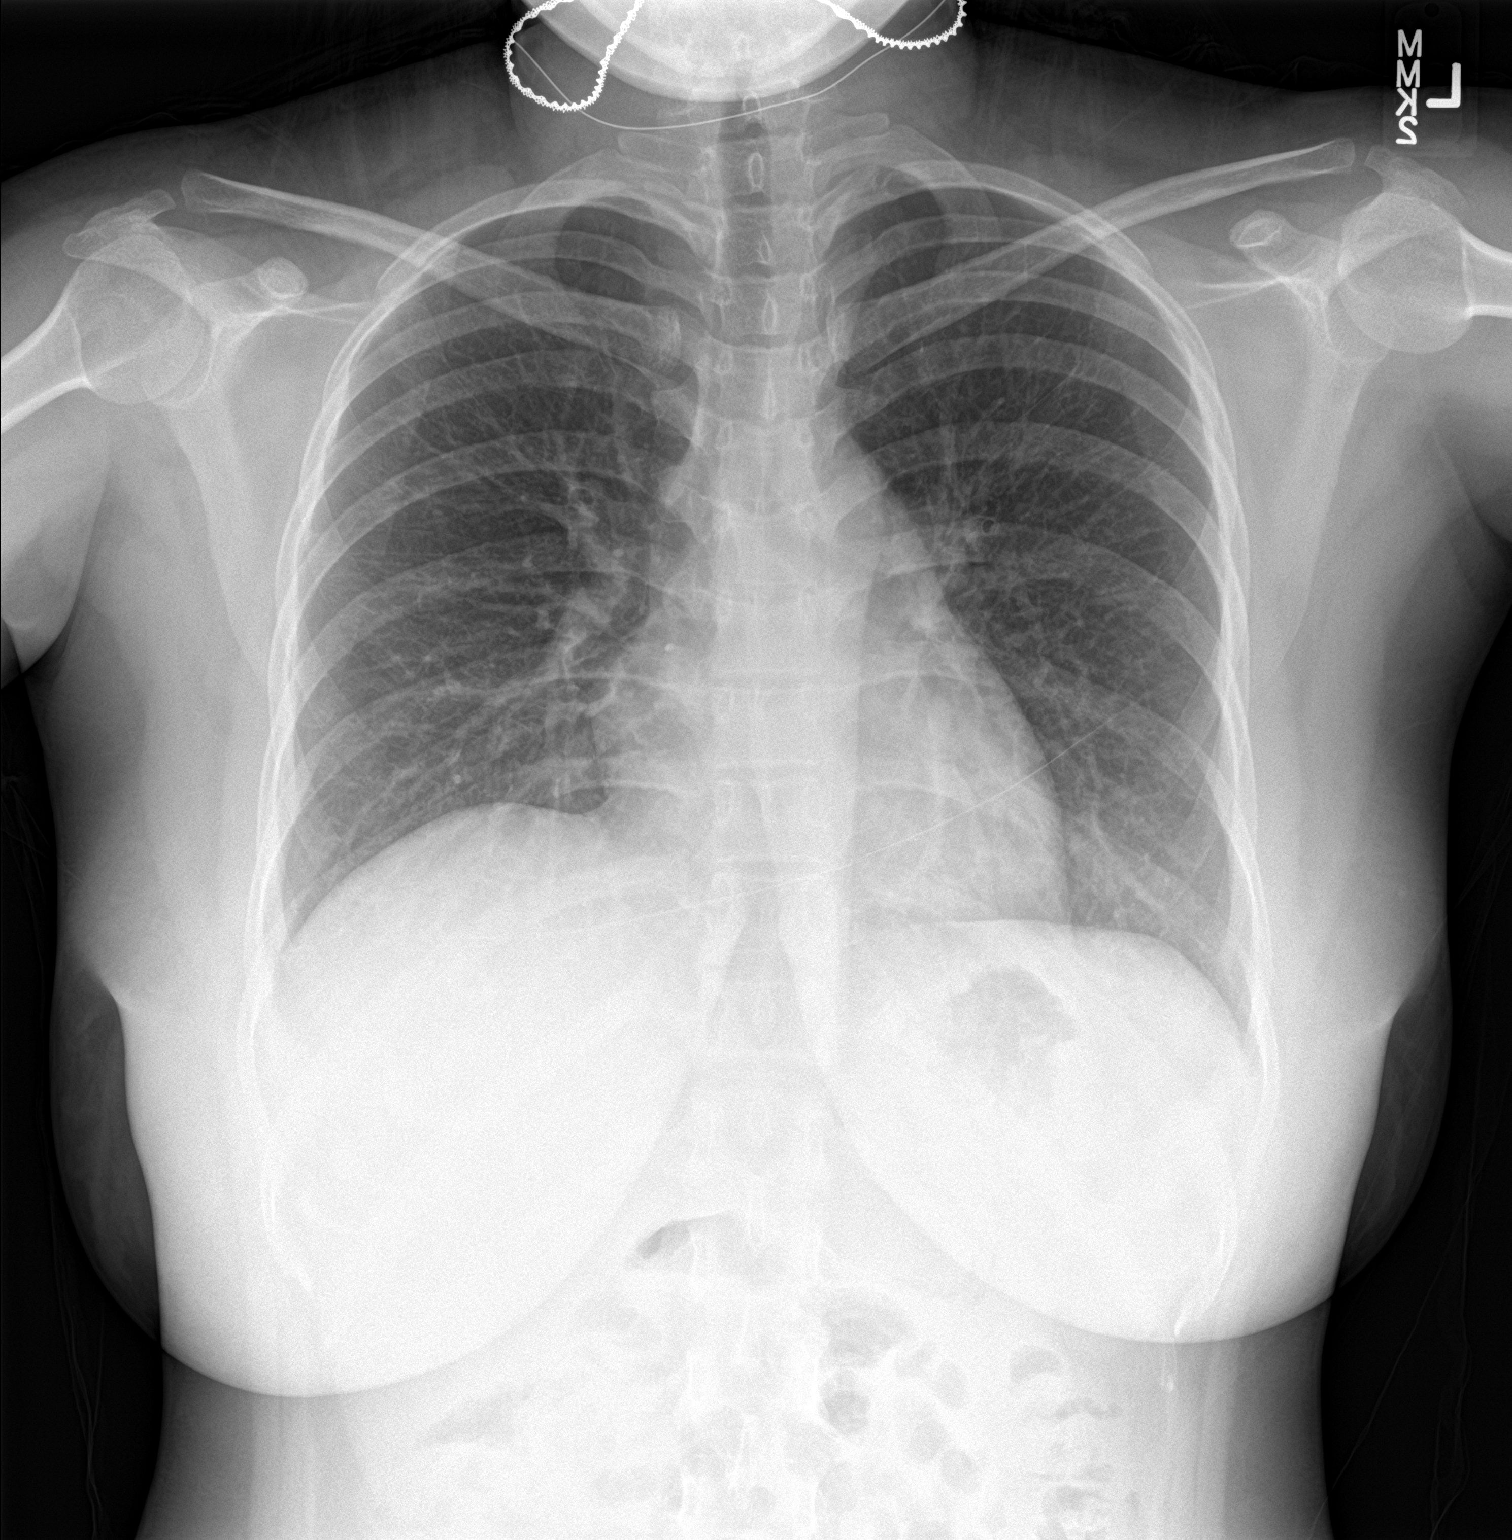

[chest lat]
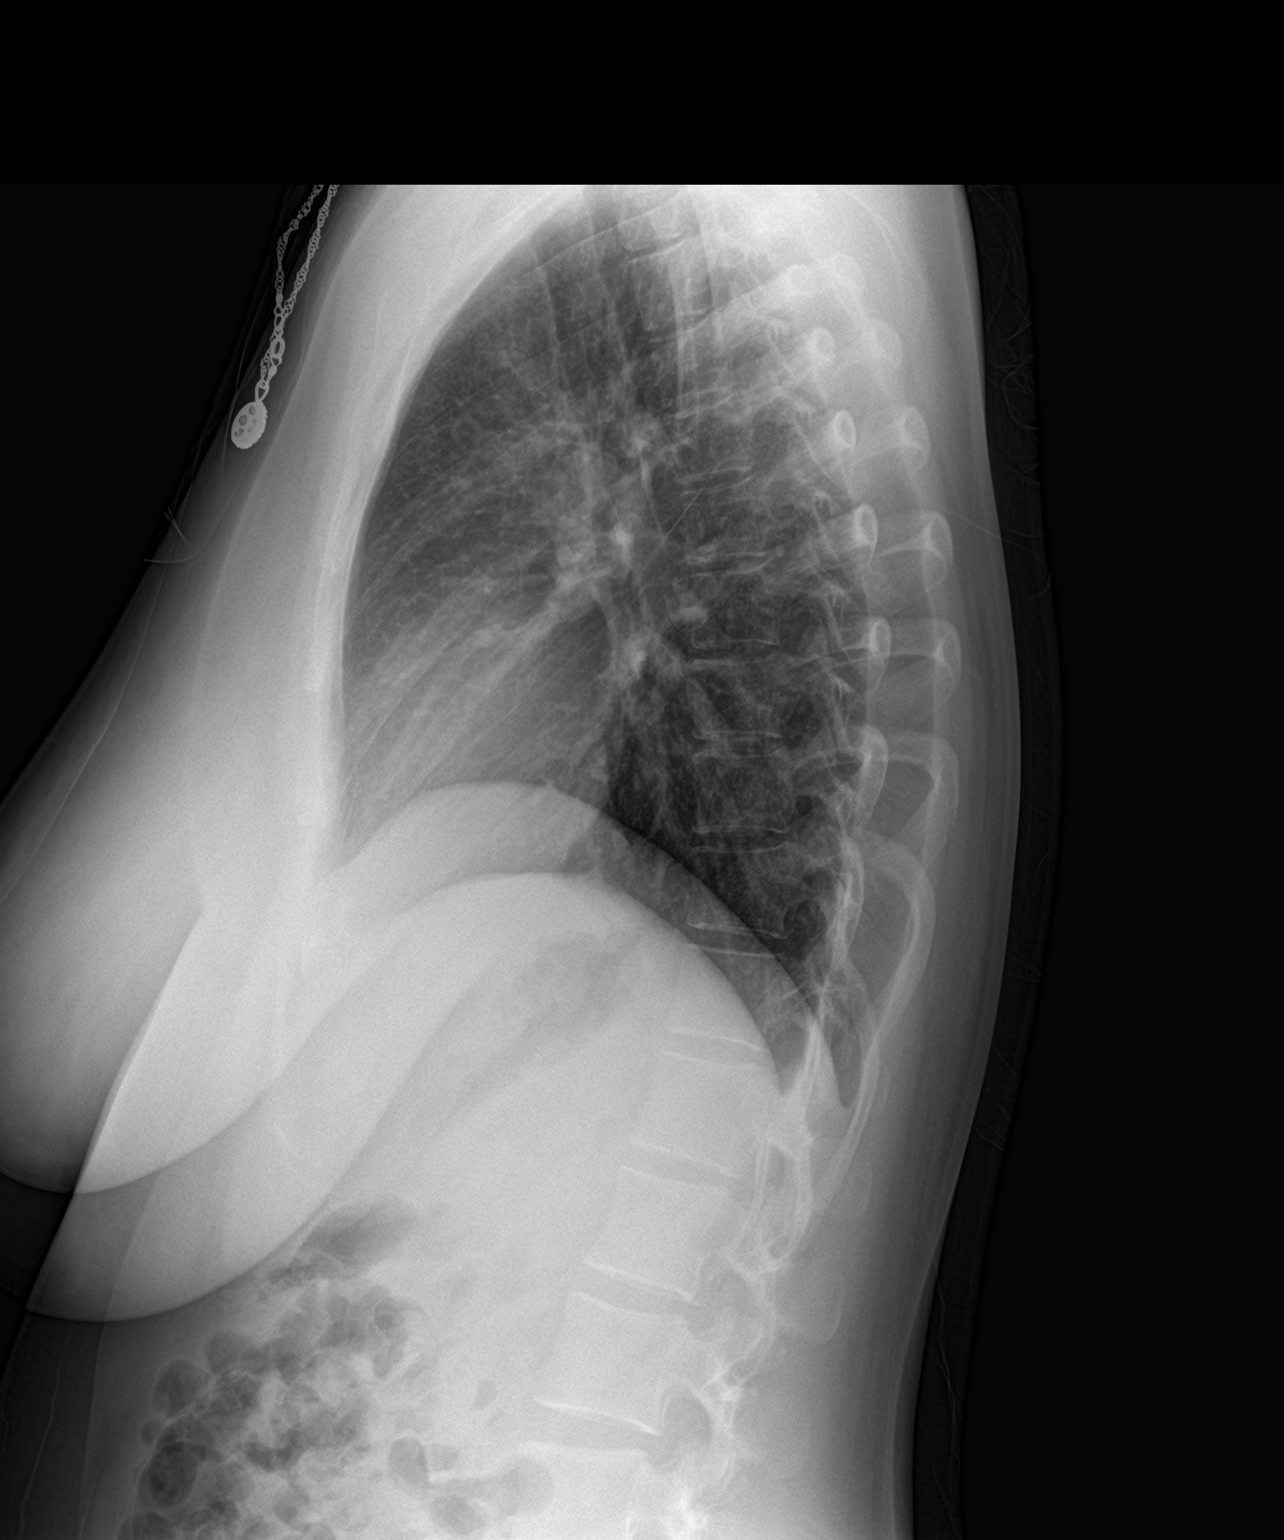

[2 of 2 positions shown; findings below may reference images not displayed]

FINDINGS: The heart size and mediastinal contours are within normal limits.
Both lungs are clear. The visualized skeletal structures are
unremarkable.
IMPRESSION: No active cardiopulmonary disease. No evidence of pneumonia or
pulmonary edema.

## 2020-10-22 DIAGNOSIS — Z20822 Contact with and (suspected) exposure to covid-19: Secondary | ICD-10-CM | POA: Diagnosis not present

## 2023-02-13 ENCOUNTER — Telehealth (INDEPENDENT_AMBULATORY_CARE_PROVIDER_SITE_OTHER): Payer: Medicaid Other

## 2023-02-13 DIAGNOSIS — Z3A22 22 weeks gestation of pregnancy: Secondary | ICD-10-CM

## 2023-02-13 DIAGNOSIS — O0992 Supervision of high risk pregnancy, unspecified, second trimester: Secondary | ICD-10-CM

## 2023-02-13 DIAGNOSIS — O0990 Supervision of high risk pregnancy, unspecified, unspecified trimester: Secondary | ICD-10-CM

## 2023-02-13 MED ORDER — BLOOD PRESSURE MONITORING DEVI: 1.0000 | 0 refills | Status: DC

## 2023-02-13 NOTE — Patient Instructions (Signed)

## 2023-02-13 NOTE — Progress Notes (Signed)
New OB Intake  I connected with Nima Coley  on 02/13/23 at  9:15 AM EDT by MyChart Video Visit and verified that I am speaking with the correct person using two identifiers. Nurse is located at [facility] and pt is located at home.  I discussed the limitations, risks, security and privacy concerns of performing an evaluation and management service by telephone and the availability of in person appointments. I also discussed with the patient that there may be a patient responsible charge related to this service. The patient expressed understanding and agreed to proceed.  I explained I am completing New OB Intake today. We discussed EDD of Not found.. Pt is G1P0. I reviewed her allergies, medications and Medical/Surgical/OB history.    Patient Active Problem List   Diagnosis Date Noted   SVD (spontaneous vaginal delivery) 12/22/2017   Supervision of normal first pregnancy, antepartum 12/20/2017   H/O fetal biophysical profile 12/20/2017   Hep B complicating pregnancy 12/20/2017    Concerns addressed today  Delivery Plans Plans to deliver at [facility]. Discussed the nature of our practice with multiple providers including residents and students. Due to the size of the practice, the delivering provider may not be the same as those providing prenatal care.   Patient is not interested in water birth. Offered upcoming OB visit with CNM to discuss further.  MyChart/Babyscripts MyChart access verified. I explained pt will have some visits in office and some virtually. Babyscripts instructions given and order placed. Patient verifies receipt of registration text/e-mail. Account successfully created and app downloaded.  Blood Pressure Cuff/Weight Scale Blood pressure cuff ordered for patient to pick-up from [employer]. Explained after first prenatal appt pt will check weekly and document in Babyscripts. Patient does have weight scale.  Anatomy US Explained first scheduled [place] will be around 19  weeks. Anatomy US scheduled for 02/26/23 at 0915a.  Is patient a CenteringPregnancy candidate?         Is patient a Mom+Baby Combined Care candidate?  Not a candidate   If accepted, confirm patient does not intend to move from the area for at least 12 months, then notify Mom+Baby staff  Interested in [place]? If yes, send referral and doula dot phrase.   Is patient a candidate for Babyscripts Optimization? Yes  First visit review I reviewed new OB appt with patient. Explained pt will be seen by Shantonette Payne,CNM at first visit. Discussed Avelina Laine genetic screening with patient. needs Panorama and Horizon.. Routine prenatal labs  needed at [facility] Visit.    Last Pap No results found for: "DIAGPAP"  Henrietta Dine, [facility] 02/13/2023  9:07 AM

## 2023-02-25 ENCOUNTER — Encounter (HOSPITAL_COMMUNITY): Payer: Self-pay | Admitting: Obstetrics and Gynecology

## 2023-02-25 ENCOUNTER — Inpatient Hospital Stay (HOSPITAL_COMMUNITY)
Admission: AD | Admit: 2023-02-25 | Discharge: 2023-02-25 | Disposition: A | Discharge status: Home / Self Care | Payer: Medicaid Other | Attending: Obstetrics and Gynecology | Admitting: Obstetrics and Gynecology

## 2023-02-25 ENCOUNTER — Encounter: Payer: Self-pay | Admitting: Certified Nurse Midwife

## 2023-02-25 DIAGNOSIS — R519 Headache, unspecified: Secondary | ICD-10-CM | POA: Diagnosis not present

## 2023-02-25 DIAGNOSIS — R42 Dizziness and giddiness: Secondary | ICD-10-CM | POA: Diagnosis not present

## 2023-02-25 DIAGNOSIS — R0602 Shortness of breath: Secondary | ICD-10-CM | POA: Insufficient documentation

## 2023-02-25 DIAGNOSIS — O99892 Other specified diseases and conditions complicating childbirth: Secondary | ICD-10-CM | POA: Insufficient documentation

## 2023-02-25 DIAGNOSIS — Z1152 Encounter for screening for COVID-19: Secondary | ICD-10-CM | POA: Insufficient documentation

## 2023-02-25 DIAGNOSIS — Z3A23 23 weeks gestation of pregnancy: Secondary | ICD-10-CM | POA: Insufficient documentation

## 2023-02-25 DIAGNOSIS — Z8619 Personal history of other infectious and parasitic diseases: Secondary | ICD-10-CM

## 2023-02-25 DIAGNOSIS — O0990 Supervision of high risk pregnancy, unspecified, unspecified trimester: Secondary | ICD-10-CM

## 2023-02-25 LAB — RESPIRATORY PANEL BY PCR

## 2023-02-25 LAB — URINALYSIS, ROUTINE W REFLEX MICROSCOPIC
Bilirubin Urine: NEGATIVE
Glucose, UA: NEGATIVE mg/dL
Hgb urine dipstick: NEGATIVE
Ketones, ur: NEGATIVE mg/dL
Leukocytes,Ua: NEGATIVE
Nitrite: NEGATIVE
Protein, ur: 100 mg/dL — AB
Specific Gravity, Urine: 1.027 (ref 1.005–1.030)
pH: 6 (ref 5.0–8.0)

## 2023-02-25 LAB — RESP PANEL BY RT-PCR (RSV, FLU A&B, COVID)  RVPGX2
Influenza A by PCR: NEGATIVE
Influenza B by PCR: NEGATIVE
Resp Syncytial Virus by PCR: NEGATIVE
SARS Coronavirus 2 by RT PCR: NEGATIVE

## 2023-02-25 MED ORDER — ACETAMINOPHEN 325 MG PO TABS
650.0000 mg | ORAL_TABLET | Freq: Once | ORAL | Status: AC
  Administered 2023-02-25: 650 mg via ORAL
  Filled 2023-02-25: qty 2

## 2023-02-25 MED ORDER — DOXYLAMINE-PYRIDOXINE 10-10 MG PO TBEC: 1.0000 | DELAYED_RELEASE_TABLET | Freq: Two times a day (BID) | ORAL | 0 refills | Status: DC

## 2023-02-25 MED ORDER — LACTATED RINGERS IV BOLUS
1000.0000 mL | Freq: Once | INTRAVENOUS | Status: AC
  Administered 2023-02-25: 1000 mL via INTRAVENOUS

## 2023-02-25 NOTE — MAU Provider Note (Cosign Needed Addendum)
MAU Provider Note  History  [id number]  Arrival date and time: 02/25/23 1256   Chief Complaint  Patient presents with   Chills   Shortness of Breath   Headache     HPI Carrie Erickson is a 27 y.o. G2P1001 at [redacted]w[redacted]d by LMP with PMHx notable for prior Hep B infection, who presents for new onset shortness of breath and feeling dizzy. She reports that she woke up today and had some shortness of breath with chills and feeling like she was going to pass out. She also endorses a headache. She reports the chills have gotten better since this morning but she is still feeling short of breath. She denies any fevers. She denies any sick contacts, though her daughter just started kindergarten. Patient denies any medical history of bleeding or clotting disorders, no prior DVTs.   Vaginal bleeding: No LOF: No Fetal Movement: slight- flutters  Contractions: No  Patient reports she has not yet been to the doctor yet and has not had any ultrasounds, one is scheduled for tomorrow. She reports she wasn't sure when her last period was and that she was told she is 23 weeks but she believes she is less. Pregnancy test was positive on     OB History  Gravida Para Term Preterm AB Living  2 1 1     1   SAB IAB Ectopic Multiple Live Births          1    # Outcome Date GA Lbr Len/2nd Weight Sex Type Anes PTL Lv  2 Current           1 Term 12/21/17 [redacted]w[redacted]d  3175 g F Vag-Spont  N LIV     Past Medical History:  Diagnosis Date   Hepatitis b     Past Surgical History:  Procedure Laterality Date   NO PAST SURGERIES      History reviewed. No pertinent family history.  Social History   Socioeconomic History   Marital status: Single    Spouse name: Not on file   Number of children: Not on file   Years of education: Not on file   Highest education level: Not on file  Occupational History   Not on file  Tobacco Use   Smoking status: Never   Smokeless tobacco: Never  Substance and Sexual Activity    Alcohol use: No   Drug use: No   Sexual activity: Not Currently    Birth control/protection: None  Other Topics Concern   Not on file  Social History Narrative   Not on file   Social Determinants of Health   Financial Resource Strain: Not on file  Food Insecurity: Not on file  Transportation Needs: Not on file  Physical Activity: Not on file  Stress: Not on file  Social Connections: Not on file  Intimate Partner Violence: Not on file    No Known Allergies  No current facility-administered medications on file prior to encounter.   Current Outpatient Medications on File Prior to Encounter  Medication Sig Dispense Refill   acetaminophen (TYLENOL) 80 MG chewable tablet Chew 80 mg by mouth every 6 (six) hours as needed.     Prenatal Vit-Fe Fumarate-FA (PRENATAL VITAMIN PO) Take 1 tablet by mouth daily.      Blood Pressure Monitoring DEVI 1 each by Does not apply route once a week. 1 each 0    ROS: Pertinent positives and negative per HPI, all others reviewed and negative  Physical Exam   BP  111/68   Pulse 88   Temp 99.1 F (37.3 C) (Oral)   Resp 20   Ht 5' (1.524 m)   Wt 64.4 kg   LMP 09/12/2022   SpO2 100%   BMI 27.71 kg/m   Patient Vitals for the past 24 hrs:  BP Temp Temp src Pulse Resp SpO2 Height Weight  02/25/23 1925 111/68 -- -- 88 -- -- -- --  02/25/23 1610 -- -- -- -- -- 100 % -- --  02/25/23 1605 -- -- -- -- -- 100 % -- --  02/25/23 1600 -- -- -- -- -- 100 % -- --  02/25/23 1555 -- -- -- -- -- 100 % -- --  02/25/23 1550 -- -- -- -- -- 100 % -- --  02/25/23 1545 -- -- -- -- -- 100 % -- --  02/25/23 1540 -- -- -- -- -- 100 % -- --  02/25/23 1535 -- -- -- -- -- 100 % -- --  02/25/23 1530 -- -- -- -- -- 100 % -- --  02/25/23 1525 -- -- -- -- -- 100 % -- --  02/25/23 1520 -- -- -- -- -- 99 % -- --  02/25/23 1515 -- -- -- -- -- 100 % -- --  02/25/23 1345 105/62 -- -- 91 -- -- -- --  02/25/23 1318 107/72 99.1 F (37.3 C) Oral (!) 107 20 96 % 5' (1.524 m)  64.4 kg    Physical Exam Vitals reviewed.  HENT:     Head: Normocephalic and atraumatic.  Eyes:     Extraocular Movements: Extraocular movements intact.     Pupils: Pupils are equal, round, and reactive to light.  Cardiovascular:     Rate and Rhythm: Regular rhythm. Tachycardia present.  Pulmonary:     Effort: Pulmonary effort is normal. No tachypnea or respiratory distress.     Breath sounds: Normal breath sounds.  Abdominal:     General: Bowel sounds are normal.     Palpations: Abdomen is soft.     Comments: Uterus palpated at level of umbilicus   Musculoskeletal:     Right lower leg: No tenderness. No edema.     Left lower leg: No tenderness. No edema.  Neurological:     General: No focal deficit present.     Mental Status: She is alert and oriented to person, place, and time.     Cervical Exam  N/A  Bedside Ultrasound not done  FHT Unable to obtain d/t EGA  Labs Results for orders placed or performed during the hospital encounter of 02/25/23 (from the past 24 hour(s))  Resp panel by RT-PCR (RSV, Flu A&B, Covid) Anterior Nasal Swab     Status: None   Collection Time: 02/25/23  1:21 PM   Specimen: Anterior Nasal Swab  Result Value Ref Range   SARS Coronavirus 2 by RT PCR NEGATIVE NEGATIVE   Influenza A by PCR NEGATIVE NEGATIVE   Influenza B by PCR NEGATIVE NEGATIVE   Resp Syncytial Virus by PCR NEGATIVE NEGATIVE  Respiratory (~20 pathogens) panel by PCR     Status: None   Collection Time: 02/25/23  1:21 PM   Specimen: Nasopharyngeal Swab; Respiratory  Result Value Ref Range   Adenovirus NOT DETECTED NOT DETECTED   Coronavirus 229E NOT DETECTED NOT DETECTED   Coronavirus HKU1 NOT DETECTED NOT DETECTED   Coronavirus NL63 NOT DETECTED NOT DETECTED   Coronavirus OC43 NOT DETECTED NOT DETECTED   Metapneumovirus NOT DETECTED NOT DETECTED   Rhinovirus /  Enterovirus NOT DETECTED NOT DETECTED   Influenza A NOT DETECTED NOT DETECTED   Influenza B NOT DETECTED NOT  DETECTED   Parainfluenza Virus 1 NOT DETECTED NOT DETECTED   Parainfluenza Virus 2 NOT DETECTED NOT DETECTED   Parainfluenza Virus 3 NOT DETECTED NOT DETECTED   Parainfluenza Virus 4 NOT DETECTED NOT DETECTED   Respiratory Syncytial Virus NOT DETECTED NOT DETECTED   Bordetella pertussis NOT DETECTED NOT DETECTED   Bordetella Parapertussis NOT DETECTED NOT DETECTED   Chlamydophila pneumoniae NOT DETECTED NOT DETECTED   Mycoplasma pneumoniae NOT DETECTED NOT DETECTED  Urinalysis, Routine w reflex microscopic -Urine, Clean Catch     Status: Abnormal   Collection Time: 02/25/23  1:22 PM  Result Value Ref Range   Color, Urine AMBER (A) YELLOW   APPearance HAZY (A) CLEAR   Specific Gravity, Urine 1.027 1.005 - 1.030   pH 6.0 5.0 - 8.0   Glucose, UA NEGATIVE NEGATIVE mg/dL   Hgb urine dipstick NEGATIVE NEGATIVE   Bilirubin Urine NEGATIVE NEGATIVE   Ketones, ur NEGATIVE NEGATIVE mg/dL   Protein, ur 761 (A) NEGATIVE mg/dL   Nitrite NEGATIVE NEGATIVE   Leukocytes,Ua NEGATIVE NEGATIVE   RBC / HPF 0-5 0 - 5 RBC/hpf   WBC, UA 0-5 0 - 5 WBC/hpf   Bacteria, UA FEW (A) NONE SEEN   Squamous Epithelial / HPF 11-20 0 - 5 /HPF   Mucus PRESENT    Ca Oxalate Crys, UA PRESENT     Imaging No results found.  MAU Course  MDM: moderate  This patient presents to the ED for concern of   Chief Complaint  Patient presents with   Chills   Shortness of Breath   Headache     These complains involves an extensive number of treatment options, and is a complaint that carries with it a high risk of complications and morbidity.  The differential diagnosis for  1. Shortness of breath INCLUDES viral infection, physiologic breathlessness of pregnancy, Pre-eclampsia, pulmonary embolism  Patient had increased SOB with headache and dizziness today. Upon arrival, patient was tachycardic. Initial COVID panel was negative. On vitals recheck, patient no longer tachycardic with appropriate O2 sat- making PE less  likely. Given patients history of chills, headache, feeling fatigue and recent exposure to daughter who just started school, more suspicious tof an infectious process.   Lab Tests:  Resp panel by RT-PCR (RSV, Flu A&B, Covid) Anterior Nasal Swab [[id number]] Collected: 02/25/23 1321  Specimen: Anterior Nasal Swab Updated: 02/25/23 1459   SARS Coronavirus 2 by RT PCR NEGATIVE   Influenza A by PCR NEGATIVE   Influenza B by PCR NEGATIVE   Resp Syncytial Virus by PCR NEGATIVE   Respiratory (~20 pathogens) panel by PCR [[id number]] Collected: 02/25/23 1321  Specimen: Respiratory from Nasopharyngeal Swab Updated: 02/25/23 1855   Adenovirus NOT DETECTED   Coronavirus 229E NOT DETECTED   Coronavirus HKU1 NOT DETECTED   Coronavirus NL63 NOT DETECTED   Coronavirus OC43 NOT DETECTED   Metapneumovirus NOT DETECTED   Rhinovirus / Enterovirus NOT DETECTED   Influenza A NOT DETECTED   Influenza B NOT DETECTED   Parainfluenza Virus 1 NOT DETECTED   Parainfluenza Virus 2 NOT DETECTED   Parainfluenza Virus 3 NOT DETECTED   Parainfluenza Virus 4 NOT DETECTED   Respiratory Syncytial Virus NOT DETECTED   Bordetella pertussis NOT DETECTED   Bordetella Parapertussis NOT DETECTED   Chlamydophila pneumoniae NOT DETECTED   Mycoplasma pneumoniae NOT DETECTED   Urinalysis, Routine w reflex microscopic -  Urine, Clean Catch [[id number]] (Abnormal) Collected: 02/25/23 1322  Specimen: Urine, Clean Catch Updated: 02/25/23 1552   Color, Urine AMBER Abnormal    APPearance HAZY Abnormal    Specific Gravity, Urine 1.027   pH 6.0   Glucose, UA NEGATIVE mg/dL    Hgb urine dipstick NEGATIVE   Bilirubin Urine NEGATIVE   Ketones, ur NEGATIVE mg/dL    Protein, ur 409 Abnormal  mg/dL    Nitrite NEGATIVE   Leukocytes,Ua NEGATIVE   RBC / HPF 0-5 RBC/hpf    WBC, UA 0-5 WBC/hpf    Bacteria, UA FEW Abnormal    Squamous Epithelial / HPF 11-20 /HPF    Mucus PRESENT   Ca Oxalate Crys, UA PRESENT    Interventions: 1L IVF  bolus   Dispostion: discharged   Assessment and Plan  27 yo F G2P001 @ ?[redacted]w[redacted]d by LMP here for increased SOB, dizziness, and headache. COVID negative. Patient received tylenol for headache, with improvement. Concentrated UA concerning for dehydration, gave 1L LR bolus. Patient reports feeling much better after invention. Vital signs stable. Highly suspicious for viral infection. - symptomatic management with tylenol and hydration  Gave return and preterm labor precautions.  Follow-up with primary OB.   Allergies as of 02/25/2023   No Known Allergies      Medication List     STOP taking these medications    ibuprofen 600 MG tablet Commonly known as: ADVIL   oseltamivir 75 MG capsule Commonly known as: TAMIFLU       TAKE these medications    acetaminophen 80 MG chewable tablet Commonly known as: TYLENOL Chew 80 mg by mouth every 6 (six) hours as needed.   Blood Pressure Monitoring Devi 1 each by Does not apply route once a week.   Doxylamine-Pyridoxine 10-10 MG Tbec Commonly known as: Diclegis Take 1 tablet by mouth in the morning and at bedtime.   PRENATAL VITAMIN PO Take 1 tablet by mouth daily.       GME ATTESTATION:  Evaluation and management procedures were performed by the [facility] Medicine Resident under my supervision. I was immediately available for direct supervision, assistance and direction throughout this encounter.  I also confirm that I have verified the information documented in the resident's note, and that I have also personally reperformed the pertinent components of the physical exam and all of the medical decision making activities.  I have also made any necessary editorial changes.  Wyn Forster, MD OB Fellow, Faculty Practice [facility], Center for [facility] 02/25/2023 9:50 PM

## 2023-02-25 NOTE — MAU Note (Signed)
RN called lab to inquire about Respiratory swab, but lab phone went to voicemail. RN sending lab requisition with note requesting add-on lab be processed.

## 2023-02-25 NOTE — Discharge Instructions (Addendum)
Dear Carrie Erickson,  Thank you for letting us participate in your care. You were seen for shortness of breath with headache and dizziness. You were COVID negative and your viral panel was negative. We gave you some tylenol for your headache and rehydrated you with IV fluids. Please continue to stay hydrated. If you experience worsening shortness of breath, difficulty breathing, fevers and chills that are concerning, or any other concerning symptoms please return to the MAU. If you have vaginal bleeding, changes in fetal movement, or loss of fluids, please return to the MAU.   POST-HOSPITAL & CARE INSTRUCTIONS Go to your follow up appointments (listed below)   DOCTOR'S APPOINTMENT   Future Appointments  Date Time Provider Department Center  03/05/2023  8:55 AM Carlynn Herald, CNM [facility]  03/12/2023  1:15 PM [facility] NURSE [facility]  03/12/2023  1:30 PM [facility] US6 [facility]     Take care and be well!  Center for [facility]    [facility]  [address] [place], [place] [address] [phone]

## 2023-02-25 NOTE — MAU Note (Signed)
.  Carrie Erickson is a 27 y.o. at [redacted]w[redacted]d here in MAU reporting: when she woke up this morning she noticed she was short of breath. She also has had chills and n/v today. Denies VB or LOF. Unsure if she has felt movement yet. Endorses a headache currently.   Pain score: 5 Vitals:   02/25/23 1318  BP: 107/72  Pulse: (!) 107  Resp: 20  Temp: 99.1 F (37.3 C)  SpO2: 96%     FHT:159 Lab orders placed from triage:  Covid, flu, RSV, UA

## 2023-02-25 NOTE — MAU Note (Signed)
RN called lab to inquire about Urinalysis. Lab tech, Rhea, answered and informed RN that that they have received sample and will process now..   

## 2023-02-26 ENCOUNTER — Ambulatory Visit: Payer: Medicaid Other

## 2023-03-04 ENCOUNTER — Encounter: Payer: Medicaid Other | Admitting: Obstetrics and Gynecology

## 2023-03-04 ENCOUNTER — Encounter: Payer: Self-pay | Admitting: Certified Nurse Midwife

## 2023-03-04 ENCOUNTER — Inpatient Hospital Stay (HOSPITAL_COMMUNITY)
Admission: AD | Admit: 2023-03-04 | Discharge: 2023-03-05 | Disposition: A | Discharge status: Home / Self Care | Payer: Medicaid Other | Attending: Obstetrics and Gynecology | Admitting: Obstetrics and Gynecology

## 2023-03-04 ENCOUNTER — Encounter (HOSPITAL_COMMUNITY): Payer: Self-pay | Admitting: Obstetrics and Gynecology

## 2023-03-04 DIAGNOSIS — Z3A24 24 weeks gestation of pregnancy: Secondary | ICD-10-CM | POA: Diagnosis not present

## 2023-03-04 DIAGNOSIS — O26892 Other specified pregnancy related conditions, second trimester: Secondary | ICD-10-CM | POA: Diagnosis not present

## 2023-03-04 DIAGNOSIS — O0992 Supervision of high risk pregnancy, unspecified, second trimester: Secondary | ICD-10-CM | POA: Diagnosis present

## 2023-03-04 DIAGNOSIS — O0990 Supervision of high risk pregnancy, unspecified, unspecified trimester: Secondary | ICD-10-CM

## 2023-03-04 DIAGNOSIS — R519 Headache, unspecified: Secondary | ICD-10-CM | POA: Diagnosis not present

## 2023-03-04 LAB — URINALYSIS, ROUTINE W REFLEX MICROSCOPIC
Bilirubin Urine: NEGATIVE
Glucose, UA: NEGATIVE mg/dL
Hgb urine dipstick: NEGATIVE
Ketones, ur: NEGATIVE mg/dL
Nitrite: NEGATIVE
Protein, ur: NEGATIVE mg/dL
Specific Gravity, Urine: 1.013 (ref 1.005–1.030)
pH: 7 (ref 5.0–8.0)

## 2023-03-04 MED ORDER — LACTATED RINGERS IV BOLUS
1000.0000 mL | Freq: Once | INTRAVENOUS | Status: AC
  Administered 2023-03-04: 1000 mL via INTRAVENOUS

## 2023-03-04 MED ORDER — DEXAMETHASONE SODIUM PHOSPHATE 10 MG/ML IJ SOLN
10.0000 mg | Freq: Once | INTRAMUSCULAR | Status: AC
  Administered 2023-03-04: 10 mg via INTRAVENOUS
  Filled 2023-03-04: qty 1

## 2023-03-04 MED ORDER — CYCLOBENZAPRINE HCL 5 MG PO TABS
10.0000 mg | ORAL_TABLET | Freq: Once | ORAL | Status: AC
  Administered 2023-03-04: 10 mg via ORAL
  Filled 2023-03-04: qty 2

## 2023-03-04 MED ORDER — PROCHLORPERAZINE EDISYLATE 10 MG/2ML IJ SOLN
10.0000 mg | Freq: Once | INTRAMUSCULAR | Status: AC
  Administered 2023-03-04: 10 mg via INTRAVENOUS
  Filled 2023-03-04: qty 2

## 2023-03-04 MED ORDER — DIPHENHYDRAMINE HCL 50 MG/ML IJ SOLN
25.0000 mg | Freq: Once | INTRAMUSCULAR | Status: AC
  Administered 2023-03-04: 25 mg via INTRAVENOUS
  Filled 2023-03-04: qty 1

## 2023-03-04 NOTE — MAU Provider Note (Signed)
Obstetric Attending MAU Note  Chief Complaint:  Headache   Event Date/Time   First Provider Initiated Contact with Patient 03/04/23 1804     HPI: Carrie Erickson is a 27 y.o. G2P1001 at [redacted]w[redacted]d who presents to maternity admissions reporting headache for several days. Has tried tylenol to no avail. Denies URI sx's and was seen here for same about 1 week ago. Resp panel negative at that time. Has some neck and shoulder discomfort. Denies contractions, leakage of fluid or vaginal bleeding. Good fetal movement.   Pregnancy Course: Receives care at [facility] Patient Active Problem List   Diagnosis Date Noted   History of hepatitis B 02/25/2023   Shortness of breath 02/25/2023   Supervision of high risk pregnancy, antepartum 02/13/2023   SVD (spontaneous vaginal delivery) 12/22/2017   Supervision of normal first pregnancy, antepartum 12/20/2017   H/O fetal biophysical profile 12/20/2017   Hep B complicating pregnancy 12/20/2017    Past Medical History:  Diagnosis Date   Hepatitis b     OB History  Gravida Para Term Preterm AB Living  2 1 1     1   SAB IAB Ectopic Multiple Live Births          1    # Outcome Date GA Lbr Len/2nd Weight Sex Type Anes PTL Lv  2 Current           1 Term 12/21/17 [redacted]w[redacted]d  3175 g F Vag-Spont  N LIV    Past Surgical History:  Procedure Laterality Date   NO PAST SURGERIES      Family History: History reviewed. No pertinent family history.  Social History: Social History   Tobacco Use   Smoking status: Never   Smokeless tobacco: Never  Substance Use Topics   Alcohol use: No   Drug use: No    Allergies: No Known Allergies  Medications Prior to Admission  Medication Sig Dispense Refill Last Dose   acetaminophen (TYLENOL) 80 MG chewable tablet Chew 80 mg by mouth every 6 (six) hours as needed.   03/04/2023   Doxylamine-Pyridoxine (DICLEGIS) 10-10 MG TBEC Take 1 tablet by mouth in the morning and at bedtime. 60 tablet 0 03/04/2023   Prenatal Vit-Fe  Fumarate-FA (PRENATAL VITAMIN PO) Take 1 tablet by mouth daily.    03/04/2023   Blood Pressure Monitoring DEVI 1 each by Does not apply route once a week. 1 each 0     ROS: Pertinent findings in history of present illness.  Physical Exam  Blood pressure (!) 95/57, pulse 97, temperature 98.5 F (36.9 C), temperature source Oral, resp. rate 17, weight 64.7 kg, last menstrual period 09/12/2022, SpO2 100%, unknown if currently breastfeeding. CONSTITUTIONAL: Well-developed, well-nourished female in no acute distress.  HENT:  Normocephalic, atraumatic, External right and left ear normal. Oropharynx is clear and moist EYES: Conjunctivae and EOM are normal. Pupils are equal, round, and reactive to light. No scleral icterus.  NECK: Normal range of motion, supple, no masses SKIN: Skin is warm and dry. No rash noted. Not diaphoretic. No erythema. No pallor. NEUROLGIC: Alert and oriented to person, place, and time. Normal reflexes, muscle tone coordination. No cranial nerve deficit noted. PSYCHIATRIC: Normal mood and affect. Normal behavior. Normal judgment and thought content. CARDIOVASCULAR: Normal heart rate noted, regular rhythm RESPIRATORY: Effort and breath sounds normal, no problems with respiration noted ABDOMEN: Soft, nontender, nondistended, gravid appropriate for gestational age MUSCULOSKELETAL: Normal range of motion. No edema and no tenderness. 2+ distal pulses.  SPECULUM EXAM: NEFG, physiologic discharge,  no blood, cervix clean    FHT:  Baseline 145 , moderate variability, 10 x 10 accelerations present, no decelerations Contractions: quiet   Labs: Results for orders placed or performed during the hospital encounter of 03/04/23 (from the past 24 hour(s))  Urinalysis, Routine w reflex microscopic -Urine, Clean Catch     Status: Abnormal   Collection Time: 03/04/23  5:50 PM  Result Value Ref Range   Color, Urine YELLOW YELLOW   APPearance CLOUDY (A) CLEAR   Specific Gravity, Urine  1.013 1.005 - 1.030   pH 7.0 5.0 - 8.0   Glucose, UA NEGATIVE NEGATIVE mg/dL   Hgb urine dipstick NEGATIVE NEGATIVE   Bilirubin Urine NEGATIVE NEGATIVE   Ketones, ur NEGATIVE NEGATIVE mg/dL   Protein, ur NEGATIVE NEGATIVE mg/dL   Nitrite NEGATIVE NEGATIVE   Leukocytes,Ua LARGE (A) NEGATIVE   RBC / HPF 0-5 0 - 5 RBC/hpf   WBC, UA 6-10 0 - 5 WBC/hpf   Bacteria, UA RARE (A) NONE SEEN   Squamous Epithelial / HPF 11-20 0 - 5 /HPF   Mucus PRESENT    Amorphous Crystal PRESENT     Imaging:  No results found.  MAU Course: Given Flexeril, no improvement Given headache cocktail Given 1L IVF HA improved now down to a 3/10  Assessment: 1. Supervision of high risk pregnancy, antepartum   2. [redacted] weeks gestation of pregnancy   3. Pregnancy headache in second trimester     Plan: Discharge home Labor precautions and fetal kick counts reviewed Follow up with OB provider   Follow-up Information     Center for [facility] Healthcare at [facility] Follow up.   Specialty: Obstetrics and Gynecology Why: keep next scheduled appointment Contact information: [address] [place] [address] [phone]                Allergies as of 03/04/2023   No Known Allergies      Medication List     TAKE these medications    acetaminophen 80 MG chewable tablet Commonly known as: TYLENOL Chew 80 mg by mouth every 6 (six) hours as needed.   Blood Pressure Monitoring Devi 1 each by Does not apply route once a week.   Doxylamine-Pyridoxine 10-10 MG Tbec Commonly known as: Diclegis Take 1 tablet by mouth in the morning and at bedtime.   PRENATAL VITAMIN PO Take 1 tablet by mouth daily.        Myna Hidalgo, DO 03/04/2023 11:34 PM

## 2023-03-04 NOTE — MAU Note (Signed)
..  Carrie Erickson is a 27 y.o. at [redacted]w[redacted]d here in MAU reporting: headache that started on Saturday. States she has tried tylenol (last dose was 1200 this evening) with no relief. Denies VB or LOF. +FM.  Pain score: 9 Vitals:   03/04/23 1739  BP: 111/73  Pulse: 90  Resp: 17  Temp: 97.9 F (36.6 C)  SpO2: 100%     FHT:158 Lab orders placed from triage:   UA

## 2023-03-05 ENCOUNTER — Ambulatory Visit (INDEPENDENT_AMBULATORY_CARE_PROVIDER_SITE_OTHER): Payer: Medicaid Other | Admitting: Certified Nurse Midwife

## 2023-03-05 ENCOUNTER — Encounter: Payer: Self-pay | Admitting: Certified Nurse Midwife

## 2023-03-05 ENCOUNTER — Other Ambulatory Visit (HOSPITAL_COMMUNITY)
Admission: RE | Admit: 2023-03-05 | Discharge: 2023-03-05 | Disposition: A | Discharge status: Home / Self Care | Payer: Medicaid Other | Source: Ambulatory Visit | Attending: Certified Nurse Midwife | Admitting: Certified Nurse Midwife

## 2023-03-05 ENCOUNTER — Other Ambulatory Visit: Payer: Self-pay

## 2023-03-05 VITALS — BP 108/74 | HR 111 | Wt 141.0 lb

## 2023-03-05 DIAGNOSIS — O98412 Viral hepatitis complicating pregnancy, second trimester: Secondary | ICD-10-CM

## 2023-03-05 DIAGNOSIS — Z3492 Encounter for supervision of normal pregnancy, unspecified, second trimester: Secondary | ICD-10-CM | POA: Insufficient documentation

## 2023-03-05 DIAGNOSIS — B1910 Unspecified viral hepatitis B without hepatic coma: Secondary | ICD-10-CM | POA: Insufficient documentation

## 2023-03-05 DIAGNOSIS — O26892 Other specified pregnancy related conditions, second trimester: Secondary | ICD-10-CM | POA: Insufficient documentation

## 2023-03-05 DIAGNOSIS — Z3A24 24 weeks gestation of pregnancy: Secondary | ICD-10-CM | POA: Diagnosis present

## 2023-03-05 DIAGNOSIS — R519 Headache, unspecified: Secondary | ICD-10-CM | POA: Insufficient documentation

## 2023-03-05 DIAGNOSIS — O0992 Supervision of high risk pregnancy, unspecified, second trimester: Secondary | ICD-10-CM | POA: Diagnosis not present

## 2023-03-05 DIAGNOSIS — Z31430 Encounter of female for testing for genetic disease carrier status for procreative management: Secondary | ICD-10-CM

## 2023-03-05 DIAGNOSIS — O98419 Viral hepatitis complicating pregnancy, unspecified trimester: Secondary | ICD-10-CM | POA: Diagnosis present

## 2023-03-05 MED ORDER — SUMATRIPTAN SUCCINATE 100 MG PO TABS: 100.0000 mg | ORAL_TABLET | Freq: Once | ORAL | 11 refills | Status: DC | PRN

## 2023-03-05 NOTE — Progress Notes (Addendum)
History:   Carrie Erickson is a 27 y.o. G2P1001 at [redacted]w[redacted]d by LMP being seen today for her first obstetrical visit.  Her obstetrical history is significant for  a history of Hepatitis B . Patient  does  intend to breast and bottle feed. Patient is aware if nipples are bleeding to discontinue breast feeding. Pregnancy history fully reviewed.  Patient reports  migraines and occasional night chills  .      HISTORY: OB History  Gravida Para Term Preterm AB Living  2 1 1  0 0 1  SAB IAB Ectopic Multiple Live Births  0 0 0 0 1    # Outcome Date GA Lbr Len/2nd Weight Sex Type Anes PTL Lv  2 Current           1 Term 12/21/17 [redacted]w[redacted]d  3175 g F Vag-Spont  N LIV    Last pap smear was done 02/14/2019 and was normal per patient report  Past Medical History:  Diagnosis Date   H/O fetal biophysical profile 12/20/2017   BPP 6/8 on 12/20/2017     Hep B complicating pregnancy 12/20/2017   Hepatitis b    Supervision of normal first pregnancy, antepartum 12/20/2017   SVD (spontaneous vaginal delivery) 12/22/2017   Past Surgical History:  Procedure Laterality Date   NO PAST SURGERIES     No family history on file. Social History   Tobacco Use   Smoking status: Never   Smokeless tobacco: Never  Substance Use Topics   Alcohol use: No   Drug use: No   No Known Allergies Current Outpatient Medications on File Prior to Visit  Medication Sig Dispense Refill   acetaminophen (TYLENOL) 80 MG chewable tablet Chew 80 mg by mouth every 6 (six) hours as needed.     Blood Pressure Monitoring DEVI 1 each by Does not apply route once a week. 1 each 0   Doxylamine-Pyridoxine (DICLEGIS) 10-10 MG TBEC Take 1 tablet by mouth in the morning and at bedtime. 60 tablet 0   Prenatal Vit-Fe Fumarate-FA (PRENATAL VITAMIN PO) Take 1 tablet by mouth daily.  (Patient not taking: Reported on 03/05/2023)     No current facility-administered medications on file prior to visit.    Review of Systems Pertinent items noted in  HPI and remainder of comprehensive ROS otherwise negative.  Indications for ASA therapy (per UpToDate) One of the following: Previous pregnancy with preeclampsia, especially early onset and with an adverse outcome No Multifetal gestation No Chronic hypertension No Type 1 or 2 diabetes mellitus No Chronic kidney disease No Autoimmune disease (antiphospholipid syndrome, systemic lupus erythematosus) No Two or more of the following: Nulliparity No Obesity (body mass index >30 kg/m2) No Family history of preeclampsia in mother or sister No Age >=35 years No Sociodemographic characteristics (African American race, low socioeconomic level) No Personal risk factors (eg, previous pregnancy with low birth weight or small for gestational age infant, previous adverse pregnancy outcome [eg, stillbirth], interval >10 years between pregnancies) No  Indications for early GDM screening (FBS, A1C, Random CBG, GTT) First-degree relative with diabetes No BMI >30kg/m2 No Age > 35 No Previous birth of an infant weighing >=4000 g No Gestational diabetes mellitus in a previous pregnancy No Glycated hemoglobin >=5.7 percent (39 mmol/mol), impaired glucose tolerance, or impaired fasting glucose on previous testing No High-risk race/ethnicity (eg, African American, Latino, Native American, [place] American, Pacific Islander) Yes Previous stillbirth of unknown cause No Maternal birthweight > 9 lbs No History of cardiovascular disease No Hypertension  or on therapy for hypertension No High-density lipoprotein cholesterol level <35 mg/dL (1.61 mmol/L) and/or a triglyceride level >250 mg/dL (0.96 mmol/L) No Polycystic ovary syndrome No Physical inactivity No Other clinical condition associated with insulin resistance (eg, severe obesity, acanthosis nigricans) No Current use of glucocorticoids No   Physical Exam:   Vitals:   03/05/23 0856  BP: 108/74  Pulse: (!) 111  Weight: 64 kg   Fetal Heart Rate  (bpm): 166  Uterine size:   Fetal heart rate 166bpm Patient informed that the ultrasound is considered a limited obstetric ultrasound and is not intended to be a complete ultrasound exam.  Patient also informed that the ultrasound is not being completed with the intent of assessing for fetal or placental anomalies or any pelvic abnormalities.  Explained that the purpose of today's ultrasound is to assess for fetal heart rate.  Patient acknowledges the purpose of the exam and the limitations of the study. General: well-developed, well-nourished female in no acute distress     Skin: normal coloration and turgor, no rashes  Neurologic: oriented, normal, negative, normal mood  Abdomen Soft non tender           Respiratory:  no respiratory distress, normal breath sounds  Pelvic No abnormal discharge PaP deferred to PP per patient request       Assessment:    Pregnancy: G2P1001 Patient Active Problem List   Diagnosis Date Noted   History of hepatitis B 02/25/2023   Shortness of breath 02/25/2023   Supervision of high risk pregnancy, antepartum 02/13/2023     Plan:    Pregnancy: G2P1001 at [redacted]w[redacted]d 1. Encounter for supervision of low-risk pregnancy in second trimester             Feeling regular and vigorous fetal movement. Doing well overall today.    2. [redacted] weeks gestation of pregnancy New OB appointment today with routine care.  Anticipatory guidance for next appointment in 4 weeks including GTT.  Labs completed today include the following (results pending):   -GC/Chlamydia probe amp ([place])not at [facility]             -Hemoglobin A1c - Culture, OB Urine - CBC/D/Plt+RPR+Rh+ABO+RubIgG...   3. Hep B complicating pregnancy History of Hep B diagnosed 5 years ago in previous pregnancy.    4. Pregnancy headache in second trimester -Reports doing well today. Seen in [facility] yesterday for a migraine not relived by tylenol. Received Flexeril, Headache cocktail, and 1 liter of IV fluids.   -Today ordered Imitrex 100mg  daily for migraines  - Consult to neurology for migraines and night chills of unknown origin     5. High-risk pregnancy Diagnosed Hep B 5 years ago in previous pregnancy. Panorama test completed today results pending.    6. Encounter of female for testing for genetic disease carrier status for procreative management Horizon basic panel completed today results pending    Initial labs drawn. Continue prenatal vitamins. Problem list reviewed and updated. Genetic Screening discussed, Panorama and Horizon: requested. Ultrasound discussed; fetal anatomic survey: scheduled. Anticipatory guidance about prenatal visits given including labs, ultrasounds, and testing. Weight gain recommendations per IOM guidelines reviewed: underweight/BMI 18.5 or less > 28 - 40 lbs; normal weight/BMI 18.5 - 24.9 > 25 - 35 lbs; overweight/BMI 25 - 29.9 > 15 - 25 lbs; obese/BMI  30 or more > 11 - 20 lbs. Discussed usage of the Babyscripts app for more information about pregnancy, and to track blood pressures. Also discussed usage of virtual visits  as additional source of managing and completing prenatal visits.  Patient was encouraged to use MyChart to review results, send requests, and have questions addressed.   The nature of Center for [facility]ulty [facility] with multiple MDs and Advanced Practice Providers was explained to patient; also emphasized that residents, students are part of our team. Routine obstetric precautions reviewed. Encouraged to seek out care at our office or emergency room [facility] preferred) for urgent and/or emergent concerns. Return in about 4 weeks (around 04/02/2023) for LOB with GTT.    Dia Sitter Student Nurse Midwife

## 2023-03-07 LAB — CULTURE, OB URINE

## 2023-03-07 LAB — URINE CULTURE, OB REFLEX

## 2023-03-08 ENCOUNTER — Encounter: Payer: Self-pay | Admitting: *Deleted

## 2023-03-12 ENCOUNTER — Encounter: Payer: Self-pay | Admitting: *Deleted

## 2023-03-12 ENCOUNTER — Ambulatory Visit: Payer: Medicaid Other

## 2023-03-12 ENCOUNTER — Ambulatory Visit: Payer: Medicaid Other | Attending: Certified Nurse Midwife | Admitting: *Deleted

## 2023-03-12 ENCOUNTER — Other Ambulatory Visit: Payer: Self-pay | Admitting: *Deleted

## 2023-03-12 DIAGNOSIS — O0992 Supervision of high risk pregnancy, unspecified, second trimester: Secondary | ICD-10-CM | POA: Diagnosis present

## 2023-03-12 DIAGNOSIS — O26842 Uterine size-date discrepancy, second trimester: Secondary | ICD-10-CM

## 2023-03-12 DIAGNOSIS — B1910 Unspecified viral hepatitis B without hepatic coma: Secondary | ICD-10-CM

## 2023-03-12 DIAGNOSIS — Z3A19 19 weeks gestation of pregnancy: Secondary | ICD-10-CM | POA: Diagnosis not present

## 2023-03-12 DIAGNOSIS — O98412 Viral hepatitis complicating pregnancy, second trimester: Secondary | ICD-10-CM | POA: Diagnosis not present

## 2023-03-12 DIAGNOSIS — O0990 Supervision of high risk pregnancy, unspecified, unspecified trimester: Secondary | ICD-10-CM

## 2023-03-12 DIAGNOSIS — Z362 Encounter for other antenatal screening follow-up: Secondary | ICD-10-CM

## 2023-03-12 LAB — PANORAMA PRENATAL TEST FULL PANEL:PANORAMA TEST PLUS 5 ADDITIONAL MICRODELETIONS: FETAL FRACTION: 9.4

## 2023-03-13 LAB — HORIZON CUSTOM: REPORT SUMMARY: POSITIVE — AB

## 2023-04-01 ENCOUNTER — Other Ambulatory Visit: Payer: Self-pay | Admitting: *Deleted

## 2023-04-01 DIAGNOSIS — O0990 Supervision of high risk pregnancy, unspecified, unspecified trimester: Secondary | ICD-10-CM

## 2023-04-02 ENCOUNTER — Other Ambulatory Visit: Payer: Medicaid Other

## 2023-04-02 ENCOUNTER — Ambulatory Visit (INDEPENDENT_AMBULATORY_CARE_PROVIDER_SITE_OTHER): Payer: Medicaid Other | Admitting: Certified Nurse Midwife

## 2023-04-02 ENCOUNTER — Other Ambulatory Visit: Payer: Self-pay

## 2023-04-02 VITALS — BP 108/72 | HR 101 | Wt 147.2 lb

## 2023-04-02 DIAGNOSIS — O09892 Supervision of other high risk pregnancies, second trimester: Secondary | ICD-10-CM

## 2023-04-02 DIAGNOSIS — Z3492 Encounter for supervision of normal pregnancy, unspecified, second trimester: Secondary | ICD-10-CM

## 2023-04-02 DIAGNOSIS — Z3A22 22 weeks gestation of pregnancy: Secondary | ICD-10-CM

## 2023-04-02 DIAGNOSIS — O0990 Supervision of high risk pregnancy, unspecified, unspecified trimester: Secondary | ICD-10-CM

## 2023-04-02 DIAGNOSIS — Z2839 Other underimmunization status: Secondary | ICD-10-CM

## 2023-04-02 NOTE — Progress Notes (Signed)
   PRENATAL VISIT NOTE  Subjective:  Carrie Erickson is a 27 y.o. G2P1001 at [redacted]w[redacted]d being seen today for ongoing prenatal care.  She is currently monitored for the following issues for this low-risk pregnancy and has High-risk pregnancy; History of hepatitis B; and Shortness of breath on their problem list.  Patient reports no complaints.  Contractions: Not present. Vag. Bleeding: None.  Movement: Present. Denies leaking of fluid.   The following portions of the patient's history were reviewed and updated as appropriate: allergies, current medications, past family history, past medical history, past social history, past surgical history and problem list.   Objective:   Vitals:   04/02/23 0959  BP: 108/72  Pulse: (!) 101  Weight: 147 lb 3.2 oz (66.8 kg)    Fetal Status: Fetal Heart Rate (bpm): 169 Fundal Height: 24 cm Movement: Present     General:  Alert, oriented and cooperative. Patient is in no acute distress.  Skin: Skin is warm and dry. No rash noted.   Cardiovascular: Normal heart rate noted  Respiratory: Normal respiratory effort, no problems with respiration noted  Abdomen: Soft, gravid, appropriate for gestational age.  Pain/Pressure: Absent     Pelvic: Cervical exam deferred        Extremities: Normal range of motion.     Mental Status: Normal mood and affect. Normal behavior. Normal judgment and thought content.   Assessment and Plan:  Pregnancy: G2P1001 at [redacted]w[redacted]d 1. Rubella non-immune status, antepartum - Patient doing well - She reports vigorous and frequent fetal movement.   2. Low-risk pregnancy in second trimester - Rubella Non-immune.  - Vaccine in PP   3. [redacted] weeks gestation of pregnancy - Previous dating had patient scheduled for a GTT today.  - Following [place] changed EDD, so GTT was completed today.   Preterm labor symptoms and general obstetric precautions including but not limited to vaginal bleeding, contractions, leaking of fluid and fetal movement were  reviewed in detail with the patient. Please refer to After Visit Summary for other counseling recommendations.   Return in about 4 weeks (around 04/30/2023) for LOB.  Future Appointments  Date Time Provider Department Center  04/09/2023  2:30 PM [facility]    Tassie Pollett Danella Deis) Suzie Portela, MSN, CNM  Center for [facility]  04/02/2023 10:49 AM

## 2023-04-03 LAB — GLUCOSE TOLERANCE, 2 HOURS W/ 1HR
Glucose, 1 hour: 156 mg/dL (ref 70–179)
Glucose, 2 hour: 124 mg/dL (ref 70–152)
Glucose, Fasting: 98 mg/dL — ABNORMAL HIGH (ref 70–91)

## 2023-04-04 ENCOUNTER — Other Ambulatory Visit: Payer: Self-pay | Admitting: Certified Nurse Midwife

## 2023-04-04 DIAGNOSIS — O24419 Gestational diabetes mellitus in pregnancy, unspecified control: Secondary | ICD-10-CM

## 2023-04-09 ENCOUNTER — Other Ambulatory Visit: Payer: Self-pay

## 2023-04-09 ENCOUNTER — Ambulatory Visit: Payer: Medicaid Other | Attending: Obstetrics

## 2023-04-09 DIAGNOSIS — O26842 Uterine size-date discrepancy, second trimester: Secondary | ICD-10-CM | POA: Diagnosis present

## 2023-04-09 DIAGNOSIS — Z3A23 23 weeks gestation of pregnancy: Secondary | ICD-10-CM

## 2023-04-09 DIAGNOSIS — B1910 Unspecified viral hepatitis B without hepatic coma: Secondary | ICD-10-CM | POA: Diagnosis not present

## 2023-04-09 DIAGNOSIS — Z362 Encounter for other antenatal screening follow-up: Secondary | ICD-10-CM | POA: Insufficient documentation

## 2023-04-09 DIAGNOSIS — O98412 Viral hepatitis complicating pregnancy, second trimester: Secondary | ICD-10-CM

## 2023-04-24 ENCOUNTER — Encounter: Payer: Self-pay | Admitting: Certified Nurse Midwife

## 2023-05-01 ENCOUNTER — Encounter: Payer: Medicaid Other | Admitting: Obstetrics and Gynecology

## 2023-05-06 ENCOUNTER — Other Ambulatory Visit: Payer: Self-pay

## 2023-05-06 ENCOUNTER — Ambulatory Visit (INDEPENDENT_AMBULATORY_CARE_PROVIDER_SITE_OTHER): Payer: Medicaid Other | Admitting: Obstetrics and Gynecology

## 2023-05-06 ENCOUNTER — Telehealth: Payer: Self-pay | Admitting: Family Medicine

## 2023-05-06 VITALS — BP 96/65 | HR 98 | Wt 148.0 lb

## 2023-05-06 DIAGNOSIS — B1910 Unspecified viral hepatitis B without hepatic coma: Secondary | ICD-10-CM

## 2023-05-06 DIAGNOSIS — O09892 Supervision of other high risk pregnancies, second trimester: Secondary | ICD-10-CM

## 2023-05-06 DIAGNOSIS — Z2839 Other underimmunization status: Secondary | ICD-10-CM

## 2023-05-06 DIAGNOSIS — O09899 Supervision of other high risk pregnancies, unspecified trimester: Secondary | ICD-10-CM

## 2023-05-06 DIAGNOSIS — Z3402 Encounter for supervision of normal first pregnancy, second trimester: Secondary | ICD-10-CM

## 2023-05-06 DIAGNOSIS — Z3A27 27 weeks gestation of pregnancy: Secondary | ICD-10-CM

## 2023-05-06 DIAGNOSIS — O98412 Viral hepatitis complicating pregnancy, second trimester: Secondary | ICD-10-CM

## 2023-05-06 DIAGNOSIS — O24419 Gestational diabetes mellitus in pregnancy, unspecified control: Secondary | ICD-10-CM

## 2023-05-06 MED ORDER — PRENATAL PLUS VITAMIN/MINERAL 27-1 MG PO TABS: 1.0000 | ORAL_TABLET | Freq: Every day | ORAL | 10 refills | Status: AC

## 2023-05-06 MED ORDER — ACCU-CHEK SOFTCLIX LANCETS MISC: 12 refills | Status: DC

## 2023-05-06 MED ORDER — ACCU-CHEK GUIDE W/DEVICE KIT: 1.0000 | PACK | Freq: Four times a day (QID) | 0 refills | Status: DC

## 2023-05-06 MED ORDER — GLUCOSE BLOOD VI STRP: ORAL_STRIP | 12 refills | Status: DC

## 2023-05-06 NOTE — Progress Notes (Addendum)
   PRENATAL VISIT NOTE  Subjective:  Carrie Erickson is a 27 y.o. G2P1001 at [redacted]w[redacted]d being seen today for ongoing prenatal care.  She is currently monitored for the following issues for this low-risk pregnancy and has Supervision of low-risk first pregnancy; High-risk pregnancy; History of hepatitis B; and Shortness of breath on their problem list.  Patient reports no complaints.  Contractions: Not present. Vag. Bleeding: None.  Movement: Present. Denies leaking of fluid.   The following portions of the patient's history were reviewed and updated as appropriate: allergies, current medications, past family history, past medical history, past social history, past surgical history and problem list.   Objective:   Vitals:   05/06/23 1521  BP: 96/65  Pulse: 98  Weight: 148 lb (67.1 kg)    Fetal Status: Fetal Heart Rate (bpm): 155 Fundal Height: 27 cm Movement: Present     General:  Alert, oriented and cooperative. Patient is in no acute distress.  Skin: Skin is warm and dry. No rash noted.   Cardiovascular: Normal heart rate noted  Respiratory: Normal respiratory effort, no problems with respiration noted  Abdomen: Soft, gravid, appropriate for gestational age.  Pain/Pressure: Absent     Pelvic: Cervical exam deferred        Extremities: Normal range of motion.  Edema: None  Mental Status: Normal mood and affect. Normal behavior. Normal judgment and thought content.   Assessment and Plan:  Pregnancy: G2P1001 at [redacted]w[redacted]d 1. Encounter for supervision of low-risk first pregnancy in second trimester BP and FHR normal Doing well, feeling regular movement    2. Gestational diabetes mellitus (GDM) in second trimester, gestational diabetes method of control unspecified Scheduling diabetic educator appt today Discussed checking blood sugars four times a day. Discussed dietary changes and exercise.   3. Rubella non-immune status, antepartum MMr pp  4. [redacted] weeks gestation of pregnancy Labs today    - Prenatal Vit-Fe Fumarate-FA (PRENATAL PLUS VITAMIN/MINERAL) 27-1 MG TABS; Take 1 tablet by mouth daily.  Dispense: 30 tablet; Refill: 10 - CBC - HIV Antibody (routine testing w rflx) - RPR  5. Hep B complicating pregnancy Unable to get labs today will schedule lab visit - Hepatitis B E Antigen - Hepatitis B DNA, ultraquantitative, PCR - Hepatitis B E Antibody - Hepatic function panel    Preterm labor symptoms and general obstetric precautions including but not limited to vaginal bleeding, contractions, leaking of fluid and fetal movement were reviewed in detail with the patient. Please refer to After Visit Summary for other counseling recommendations.   Return schedule diabetes education appt. Future Appointments  Date Time Provider Department Center  06/03/2023  2:35 PM Reva Bores, MD [facility]     Albertine Grates, FNP

## 2023-05-06 NOTE — Telephone Encounter (Signed)
Patient refused diabetes education appointment on 11/18.

## 2023-05-07 ENCOUNTER — Other Ambulatory Visit: Payer: Medicaid Other

## 2023-05-07 DIAGNOSIS — B1910 Unspecified viral hepatitis B without hepatic coma: Secondary | ICD-10-CM

## 2023-05-07 DIAGNOSIS — O98419 Viral hepatitis complicating pregnancy, unspecified trimester: Secondary | ICD-10-CM

## 2023-05-08 LAB — HEPATITIS B DNA, ULTRAQUANTITATIVE, PCR
HBV DNA SERPL PCR-ACNC: 13700 [IU]/mL
HBV DNA SERPL PCR-LOG IU: 4.137 {Log}

## 2023-05-08 LAB — HEPATIC FUNCTION PANEL
ALT: 12 [IU]/L (ref 0–32)
AST: 14 [IU]/L (ref 0–40)
Albumin: 3.6 g/dL — ABNORMAL LOW (ref 4.0–5.0)
Alkaline Phosphatase: 62 [IU]/L (ref 44–121)
Bilirubin Total: 0.2 mg/dL (ref 0.0–1.2)
Bilirubin, Direct: 0.1 mg/dL (ref 0.00–0.40)
Total Protein: 6.3 g/dL (ref 6.0–8.5)

## 2023-05-08 LAB — HEPATITIS B E ANTIGEN: Hep B E Ag: NEGATIVE

## 2023-05-08 LAB — HEPATITIS B E ANTIBODY: Hep B E Ab: REACTIVE — AB

## 2023-05-09 ENCOUNTER — Telehealth: Payer: Self-pay

## 2023-05-09 ENCOUNTER — Other Ambulatory Visit: Payer: Self-pay

## 2023-05-09 DIAGNOSIS — Z3402 Encounter for supervision of normal first pregnancy, second trimester: Secondary | ICD-10-CM

## 2023-05-09 NOTE — Telephone Encounter (Signed)
Called patient regarding lab appointment for 26 wk labs. Scheduled appointment for patient to come in on Monday, 05/09/23 at 1330. Patient confirmed lab date and time. Patient denies any other needs at this time.    Marcelino Duster, RN

## 2023-05-13 ENCOUNTER — Other Ambulatory Visit: Payer: Medicaid Other

## 2023-05-13 ENCOUNTER — Other Ambulatory Visit: Payer: Self-pay

## 2023-05-13 DIAGNOSIS — Z3402 Encounter for supervision of normal first pregnancy, second trimester: Secondary | ICD-10-CM

## 2023-05-14 LAB — CBC
Hematocrit: 35.6 % (ref 34.0–46.6)
Hemoglobin: 11.8 g/dL (ref 11.1–15.9)
MCH: 26.8 pg (ref 26.6–33.0)
MCHC: 33.1 g/dL (ref 31.5–35.7)
MCV: 81 fL (ref 79–97)
Platelets: 290 10*3/uL (ref 150–450)
RBC: 4.4 x10E6/uL (ref 3.77–5.28)
RDW: 13.8 % (ref 11.7–15.4)
WBC: 8.9 10*3/uL (ref 3.4–10.8)

## 2023-05-14 LAB — HIV ANTIBODY (ROUTINE TESTING W REFLEX): HIV Screen 4th Generation wRfx: NONREACTIVE

## 2023-05-14 LAB — RPR: RPR Ser Ql: NONREACTIVE

## 2023-06-03 ENCOUNTER — Other Ambulatory Visit: Payer: Self-pay

## 2023-06-03 ENCOUNTER — Encounter: Payer: Self-pay | Admitting: Family Medicine

## 2023-06-03 ENCOUNTER — Ambulatory Visit (INDEPENDENT_AMBULATORY_CARE_PROVIDER_SITE_OTHER): Payer: Medicaid Other | Admitting: Family Medicine

## 2023-06-03 VITALS — BP 103/66 | HR 102 | Wt 150.0 lb

## 2023-06-03 DIAGNOSIS — D582 Other hemoglobinopathies: Secondary | ICD-10-CM

## 2023-06-03 DIAGNOSIS — Z2839 Other underimmunization status: Secondary | ICD-10-CM

## 2023-06-03 DIAGNOSIS — Z3A31 31 weeks gestation of pregnancy: Secondary | ICD-10-CM

## 2023-06-03 DIAGNOSIS — Z8619 Personal history of other infectious and parasitic diseases: Secondary | ICD-10-CM

## 2023-06-03 DIAGNOSIS — Z3402 Encounter for supervision of normal first pregnancy, second trimester: Secondary | ICD-10-CM

## 2023-06-03 DIAGNOSIS — O99113 Other diseases of the blood and blood-forming organs and certain disorders involving the immune mechanism complicating pregnancy, third trimester: Secondary | ICD-10-CM

## 2023-06-03 DIAGNOSIS — O24419 Gestational diabetes mellitus in pregnancy, unspecified control: Secondary | ICD-10-CM | POA: Insufficient documentation

## 2023-06-03 DIAGNOSIS — O09899 Supervision of other high risk pregnancies, unspecified trimester: Secondary | ICD-10-CM | POA: Insufficient documentation

## 2023-06-03 NOTE — Progress Notes (Signed)
   PRENATAL VISIT NOTE  Subjective:  Carrie Erickson is a 27 y.o. G2P1001 at [redacted]w[redacted]d being seen today for ongoing prenatal care.  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  Patient reports no complaints.  Contractions: Not present. Vag. Bleeding: None.  Movement: Present. Denies leaking of fluid.   The following portions of the patient's history were reviewed and updated as appropriate: allergies, current medications, past family history, past medical history, past social history, past surgical history and problem list.   Objective:   Vitals:   06/03/23 1445  BP: 103/66  Pulse: (!) 102  Weight: 150 lb (68 kg)    Fetal Status: Fetal Heart Rate (bpm): 150 Fundal Height: 32 cm Movement: Present     General:  Alert, oriented and cooperative. Patient is in no acute distress.  Skin: Skin is warm and dry. No rash noted.   Cardiovascular: Normal heart rate noted  Respiratory: Normal respiratory effort, no problems with respiration noted  Abdomen: Soft, gravid, appropriate for gestational age.  Pain/Pressure: Absent     Pelvic: Cervical exam deferred        Extremities: Normal range of motion.  Edema: None  Mental Status: Normal mood and affect. Normal behavior. Normal judgment and thought content.   Assessment and Plan:  Pregnancy: G2P1001 at [redacted]w[redacted]d 1. Gestational diabetes mellitus (GDM) in second trimester, gestational diabetes method of control unspecified (Primary) To see diabetes education - Ambulatory referral to Nutrition and Diabetic Education  2. Encounter for supervision of low-risk first pregnancy in second trimester Continue prenatal care. Declined TDaP today  3. Rubella non-immune status, antepartum MMR pp  4. 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 History of hepatitis B Check LFT's and consider [facility] referral - Comp Met (CMET)  6. [redacted] weeks gestation of pregnancy   Preterm labor symptoms and general obstetric precautions including but not limited to vaginal bleeding, contractions, leaking of fluid and fetal movement were reviewed in detail with the patient. Please refer to After Visit Summary for other counseling recommendations.   Return in 2 weeks (on 06/17/2023).  Future Appointments  Date Time Provider Department Center  06/05/2023  2:45 PM [facility] GDM CLASS [facility]DM  06/17/2023  1:35 PM [place]    Reva Bores, MD

## 2023-06-03 NOTE — Patient Instructions (Addendum)
Following an appropriate diet and keeping your blood sugar under control is the most important thing to do for your health and that of your unborn baby.  Please check your blood sugar 4 times daily.  Please keep accurate BS logs and bring them with you to every visit.  Please bring your meter also.  Goals for Blood sugar should be: 1. Fasting (first thing in the morning before eating) should be less than 90.   2.  2 hours after meals should be less than 120.  Please eat 3 meals and 3 snacks.  Include protein (meat, dairy-cheese, eggs, nuts) with all meals.  Be mindful that carbohydrates increase your blood sugar.  Not just sweet food (cookies, cake, donuts, fruit, juice, soda) but also bread, pasta, rice, and potatoes.  You have to limit how many carbs you are eating.  Adding exercise, as little as 30 minutes a day can decrease your blood sugar. Consider 10 minute walk after each meal.

## 2023-06-04 LAB — COMPREHENSIVE METABOLIC PANEL
ALT: 12 [IU]/L (ref 0–32)
AST: 14 [IU]/L (ref 0–40)
Albumin: 3.6 g/dL — ABNORMAL LOW (ref 4.0–5.0)
Alkaline Phosphatase: 91 [IU]/L (ref 44–121)
BUN/Creatinine Ratio: 11 (ref 9–23)
BUN: 6 mg/dL (ref 6–20)
Bilirubin Total: 0.3 mg/dL (ref 0.0–1.2)
CO2: 20 mmol/L (ref 20–29)
Calcium: 9.3 mg/dL (ref 8.7–10.2)
Chloride: 103 mmol/L (ref 96–106)
Creatinine, Ser: 0.54 mg/dL — ABNORMAL LOW (ref 0.57–1.00)
Globulin, Total: 2.8 g/dL (ref 1.5–4.5)
Glucose: 73 mg/dL (ref 70–99)
Potassium: 4 mmol/L (ref 3.5–5.2)
Sodium: 138 mmol/L (ref 134–144)
Total Protein: 6.4 g/dL (ref 6.0–8.5)
eGFR: 131 mL/min/{1.73_m2} (ref >59)

## 2023-06-05 ENCOUNTER — Encounter: Payer: Self-pay | Admitting: Dietician

## 2023-06-05 ENCOUNTER — Encounter: Payer: Medicaid Other | Attending: Family Medicine | Admitting: Dietician

## 2023-06-05 ENCOUNTER — Encounter: Payer: Self-pay | Admitting: Certified Nurse Midwife

## 2023-06-05 DIAGNOSIS — O24419 Gestational diabetes mellitus in pregnancy, unspecified control: Secondary | ICD-10-CM | POA: Diagnosis present

## 2023-06-05 NOTE — Progress Notes (Signed)
Patient was seen on 06/05/2023 for Gestational Diabetes self-management class at the [facility]. The following learning objectives were met by the patient during this course:  States the definition of Gestational Diabetes States why dietary management is important in controlling blood glucose Describes the effects each nutrient has on blood glucose levels Demonstrates ability to create a balanced meal plan Demonstrates carbohydrate counting  States when to check blood glucose levels Demonstrates proper blood glucose monitoring techniques States the effect of stress and exercise on blood glucose levels States the importance of limiting caffeine and abstaining from alcohol and smoking  Blood glucose monitor: Accu Chek Blood glucose reading: 89 mg/dL  Patient instructed to monitor glucose levels: QID FBS: 60 - <90 1 hour: <140 2 hour: <120  *Patient received handouts: Nutrition Diabetes and Pregnancy Carbohydrate Counting List Blood glucose log Snack ideas for diabetes during pregnancy  Patient will be seen for follow-up as needed.

## 2023-06-17 ENCOUNTER — Encounter: Payer: Self-pay | Admitting: Advanced Practice Midwife

## 2023-06-17 ENCOUNTER — Encounter: Payer: Medicaid Other | Admitting: Advanced Practice Midwife

## 2023-06-19 NOTE — L&D Delivery Note (Signed)
 OB/GYN Faculty Practice Delivery Note  Carrie Erickson is a 28 y.o. G2P1001 s/p NSVD at [redacted]w[redacted]d. She was admitted for SOL.   ROM: SROM immediately before delivery with moderate meconium-stained fluid GBS Status: Negative/-- (01/21 1439) Maximum Maternal Temperature: Temp (24hrs), Avg:98.1 F (36.7 C), Min:98.1 F (36.7 C), Max:98.1 F (36.7 C)   Labor Progress: Initial SVE: 8/90/-2.  No augmentation  required. She then rapidly progressed to complete.   Delivery Date/Time: 07/24/23 0121 Delivery: Patient felt urge to push shortly after arriving to L&D floor. I arrived, and patient found to be complete/+2. Delivered within 2 contractions. SROM while crowning. Head delivered OA to LOA. No nuchal cord present. Shoulder and body delivered in usual fashion. Infant with spontaneous cry, placed on mother's abdomen, dried and stimulated. Cord clamped x 2 after +1-minute delay, and cut by FOB. Cord gases not obtained. Cord blood drawn. Placenta delivered spontaneously with gentle cord traction. Fundus firm with massage and Pitocin . Labia, perineum, and vagina inspected with  R labial and 1st degree perineal  lacerations. R labial hemostatic and did not require repair. 1st degree required interrupted suture for hemostasis, and small vaginal extension repaired with running sutures of 3-0 vicryl. Lidocaine  for anesthesia for repair. Mom and baby to postpartum. Baby Weight: pending  Placenta: 3 vessel, intact. Sent to L&D Complications: Precipitous delivery Lacerations: R labial, 1st degree perineal EBL: 78 mL Anesthesia: topical  Infant: baby boy APGAR (1 MIN): 8  APGAR (5 MINS): 8  APGAR (10 MINS):    Carrie CHRISTELLA Moats, MD [facility] Family Medicine Fellow, [facility] for [facility], [facility] 07/24/2023, 1:47 AM

## 2023-06-25 ENCOUNTER — Other Ambulatory Visit: Payer: Self-pay

## 2023-06-25 ENCOUNTER — Ambulatory Visit (INDEPENDENT_AMBULATORY_CARE_PROVIDER_SITE_OTHER): Payer: Medicaid Other | Admitting: Certified Nurse Midwife

## 2023-06-25 VITALS — BP 110/83 | HR 97 | Wt 147.6 lb

## 2023-06-25 DIAGNOSIS — O0990 Supervision of high risk pregnancy, unspecified, unspecified trimester: Secondary | ICD-10-CM

## 2023-06-25 DIAGNOSIS — O24415 Gestational diabetes mellitus in pregnancy, controlled by oral hypoglycemic drugs: Secondary | ICD-10-CM

## 2023-06-25 DIAGNOSIS — O99113 Other diseases of the blood and blood-forming organs and certain disorders involving the immune mechanism complicating pregnancy, third trimester: Secondary | ICD-10-CM

## 2023-06-25 DIAGNOSIS — D582 Other hemoglobinopathies: Secondary | ICD-10-CM

## 2023-06-25 DIAGNOSIS — Z2839 Other underimmunization status: Secondary | ICD-10-CM

## 2023-06-25 DIAGNOSIS — Z8619 Personal history of other infectious and parasitic diseases: Secondary | ICD-10-CM

## 2023-06-25 DIAGNOSIS — O09899 Supervision of other high risk pregnancies, unspecified trimester: Secondary | ICD-10-CM

## 2023-06-25 DIAGNOSIS — Z3A34 34 weeks gestation of pregnancy: Secondary | ICD-10-CM

## 2023-06-25 MED ORDER — METFORMIN HCL 500 MG PO TABS: 500.0000 mg | ORAL_TABLET | Freq: Two times a day (BID) | ORAL | 3 refills | Status: DC

## 2023-06-25 NOTE — Progress Notes (Signed)
   PRENATAL VISIT NOTE  Subjective:  Carrie Erickson is a 28 y.o. G2P1001 at [redacted]w[redacted]d being seen today for ongoing prenatal care.  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  Patient reports  cramping at night .  Contractions: Not present. Vag. Bleeding: None.  Movement: Present. Denies leaking of fluid.   The following portions of the patient's history were reviewed and updated as appropriate: allergies, current medications, past family history, past medical history, past social history, past surgical history and problem list.   Objective:   Vitals:   06/25/23 0856  BP: 110/83  Pulse: 97  Weight: 147 lb 9.6 oz (67 kg)    Fetal Status: Fetal Heart Rate (bpm): 152 Fundal Height: 34 cm Movement: Present     General:  Alert, oriented and cooperative. Patient is in no acute distress.  Skin: Skin is warm and dry. No rash noted.   Cardiovascular: Normal heart rate noted  Respiratory: Normal respiratory effort, no problems with respiration noted  Abdomen: Soft, gravid, appropriate for gestational age.  Pain/Pressure: Absent     Pelvic: Cervical exam deferred        Extremities: Normal range of motion.  Edema: None  Mental Status: Normal mood and affect. Normal behavior. Normal judgment and thought content.   Assessment and Plan:  Pregnancy: G2P1001 at [redacted]w[redacted]d 1. Supervision of high risk pregnancy, antepartum (Primary) - Doing well, feeling regular and vigorous fetal movement - Patient reports recent respiratory illness, counseled immune boosting foods and supplements.  - Occasional cramping at night. Round ligament pain. Counseled on hydration and pregnancy support belt.  No s/s of vaginitis, but may consider with GBS swab PRN.   2. [redacted] weeks  gestation of pregnancy - Routine prenatal care.   3. Rubella non-immune status, antepartum - MMR postpartum.   4. 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 History of hepatitis B - Recent labs WNL.   6. Gestational diabetes, not diet controlled.  - Reviewed CBG log, >50% out of range. - Consulted Dr. Lola who recommended starting Metformin  500 mg BID vs. Better dietary control. - Patient opts to start Metformin  500mg  BID. Prescription sent.  - Couinseled on importance of good dietary choices even with oral hypoglycemic prescription.  Preterm labor symptoms and general obstetric precautions including but not limited to vaginal bleeding, contractions, leaking of fluid and fetal movement were reviewed in detail with the patient. Please refer to After Visit Summary for other counseling recommendations.   Return in about 1 week (around 07/02/2023) for HROB with GBS.  Future Appointments  Date Time Provider Department Center  07/02/2023  1:15 PM Loyola Fish, MD [facility]     Camie DELENA Rote, CNM

## 2023-07-02 ENCOUNTER — Ambulatory Visit (INDEPENDENT_AMBULATORY_CARE_PROVIDER_SITE_OTHER): Payer: Medicaid Other | Admitting: Obstetrics and Gynecology

## 2023-07-02 ENCOUNTER — Other Ambulatory Visit: Payer: Self-pay

## 2023-07-02 ENCOUNTER — Other Ambulatory Visit (HOSPITAL_COMMUNITY)
Admission: RE | Admit: 2023-07-02 | Discharge: 2023-07-02 | Disposition: A | Discharge status: Home / Self Care | Payer: Medicaid Other | Source: Ambulatory Visit | Attending: Family Medicine | Admitting: Family Medicine

## 2023-07-02 VITALS — BP 102/73 | HR 116 | Wt 150.0 lb

## 2023-07-02 DIAGNOSIS — Z8619 Personal history of other infectious and parasitic diseases: Secondary | ICD-10-CM

## 2023-07-02 DIAGNOSIS — Z3403 Encounter for supervision of normal first pregnancy, third trimester: Secondary | ICD-10-CM | POA: Insufficient documentation

## 2023-07-02 DIAGNOSIS — Z3A35 35 weeks gestation of pregnancy: Secondary | ICD-10-CM | POA: Insufficient documentation

## 2023-07-02 DIAGNOSIS — O26843 Uterine size-date discrepancy, third trimester: Secondary | ICD-10-CM

## 2023-07-02 DIAGNOSIS — Z2839 Other underimmunization status: Secondary | ICD-10-CM

## 2023-07-02 DIAGNOSIS — D582 Other hemoglobinopathies: Secondary | ICD-10-CM

## 2023-07-02 DIAGNOSIS — O24419 Gestational diabetes mellitus in pregnancy, unspecified control: Secondary | ICD-10-CM

## 2023-07-02 DIAGNOSIS — O09893 Supervision of other high risk pregnancies, third trimester: Secondary | ICD-10-CM

## 2023-07-02 NOTE — Progress Notes (Signed)
 Fasting 79-80s Postprandial - 112-116s

## 2023-07-02 NOTE — Progress Notes (Signed)
 PRENATAL VISIT NOTE  Subjective:  Carrie Erickson is a 28 y.o. G2P1001 at [redacted]w[redacted]d being seen today for ongoing prenatal care.  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  Patient reports no complaints. Answered questions about Metformin  dosage timing and expected side effects. Exchanged brown for white rice with improved glycemic control. Contractions: Irritability. Vag. Bleeding: None.  Movement: Present. Denies leaking of fluid.   The following portions of the patient's history were reviewed and updated as appropriate: allergies, current medications, past family history, past medical history, past social history, past surgical history and problem list.   Objective:   Vitals:   07/02/23 1327  BP: 102/73  Pulse: (!) 116  Weight: 150 lb (68 kg)    Fetal Status: Fetal Heart Rate (bpm): 140 Fundal Height: 30 cm Movement: Present     General:  Alert, oriented and cooperative. Patient is in no acute distress.  Skin: Skin is warm and dry. No rash noted.   Cardiovascular: Normal heart rate noted  Respiratory: Normal respiratory effort, no problems with respiration noted  Abdomen: Soft, gravid, appropriate for gestational age.  Pain/Pressure: Present     Pelvic: Cervical exam deferred        Extremities: Normal range of motion.  Edema: None  Mental Status: Normal mood and affect. Normal behavior. Normal judgment and thought content.   Assessment and Plan:  Pregnancy: G2P1001 at [redacted]w[redacted]d  [place] was seen today for routine prenatal visit.  [redacted] weeks gestation of pregnancy -     Culture, beta strep (group b only) -     Cervicovaginal ancillary only -     US  MFM OB FOLLOW UP; Future  Gestational diabetes mellitus  (GDM) in third trimester, gestational diabetes method of control unspecified Overview: Reports compliant with Metformin  500mg  BID - fasting 79-80s - postprandial 112-116s   Encounter for supervision of low-risk first pregnancy in third trimester Overview:  NURSING  PROVIDER  [employer], [employer] Dating by U/S at 19 wks  [facility] Model Traditional Anatomy U/S normal  Initiated care at                  Sungard  English              LAB RESULTS   Support Person  Genetics NIPS: LR female AFP:     NT/IT (FT only)     Carrier Screen Horizon: carrier Hgb E  Rhogam  A/Positive/-- (09/17 0952) A1C/GTT Early: 5.2 Third trimester: 98/156/124  Flu Vaccine Decline 05/06/23    TDaP Vaccine  Declined 06/03/2023 Blood Type A/Positive/-- (09/17 0952)  Covid Vaccine  Antibody Negative (09/17 0952)  RSV Vaccine  Rubella <0.90 (09/17 9047)  Feeding Plan bottle RPR Non Reactive (11/25 1339)  Contraception Undecided  HBsAg Confirm. indicated (09/17 9047)  Circumcision No HIV Non Reactive (11/25 1339)  Pediatrician  [facility] HCVAb Non Reactive (09/17 9047)  Prenatal Classes     BTL Consent  Pap No results found for: DIAGPAP  BTL Pre-payment  GC/CT Initial:   36wks:    VBAC Consent  GBS   For PCN allergy, check sensitivities        DME Rx [ ]  BP cuff [ ]  Weight Scale Waterbirth  [ ]   Class [ ]  Consent [ ]  CNM visit  PHQ9 & GAD7 [  ] new OB [  ] 28 weeks  [  ] 36 weeks Induction  [ ]  Orders Entered [ ] Foley Y/N     Orders: -     Culture, beta strep (group b only) -     Cervicovaginal ancillary only -     US  MFM OB FOLLOW UP; Future  History of hepatitis B Overview: Hepatitis B  If HepBsAg is positive-->Check HBeAg, anti-HBe, HBV DNA, LFT's  If HBV DNA > 2 x 105 or HBeAg +, or elevated LFTs-->refer to hepatologist  If HBV DNA > 2 x 104, HBeAg-, and abnl LFT's-->refer to hepatologist   Check LFT's q 3 months up to 6 months pp  Check HBV DNA q 3  months, if LFT's are up or at 26-28 wks-->if increasing here, consider referral for treatment  In high risk women who are negative for HepBsAg-->check HepBsAb and HepBCore Ab-if no evidence of immunity or prior exposure then vaccinate patient   Algorithm Below from UpToDate  Anti-HBc: hepatitis B core antibody; anti-HBe: hepatitis B e antibody; anti-HBs: hepatitis B surface antibody; HBeAg: hepatitis B e antigen; HBIG: hepatitis B immune globulin; HBsAg: hepatitis B surface antigen; HBV: hepatitis B virus. * Check anti-HBs and anti-HBc if mother is at high risk for HBV infection (eg, injection drug user, sexual partner or household contact has chronic HBV). Mothers with no evidence of prior HBV infection (ie, negative for HBsAg, anti-HBs, and anti-HBc) should be vaccinated. In addition, such women should have HBsAg repeated late in pregnancy (approximately 28 weeks).  Women who have a high HBV DNA (>200,000 int. units/mL), elevated aminotransferase levels, and/or a positive HBeAg should be referred to a hepatologist to see if early initiation of antiviral medications is needed. ? Start at 28 to [redacted] weeks gestation. Tenofovir disoproxil fumarate preferred rather than other antiviral agents.  ? For those who continue antiviral therapy after delivery, the pros and cons of breastfeeding must be discussed with the mother.   From Gastroenterology Notes for a patient Treatment with AVT is recommended in the third trimester in HBsAg+ pregnant women when maternal HBV DNA is >200,000 IU/mL to reduce the risk for MTCT. Treatment is usually started at 28?32 weeks of gestation with tenofovir.  Consensus treatment decisions for HBV during pregnancy for DNA levels >20,000 and <200,000 have not been reached in society guidelines. Treatment is indicated for acute HBV in pregnancy when ALT >5 the ULN Hepatitis B immune globulin (HBIG) and HBV vaccine should be administered to their newborn <12 hours after delivery   and two additional doses of vaccine within 6? to 12?month period  She should be monitored closely for up to 6 months after delivery for hepatitis flares and seroconversion. Long?term follow?up should be continued to assess need for future therapy.  LFT's and Hep B viral load every 3 months , at 26 to 28 weeks orders will be placed  Sanghi V, Lindenmeyer CC. Viral Hepatitis in Pregnancy: An Update on Screening, Diagnosis, and Management. Clin Liver Dis ([place]). 2021 Aug 16;18(1):7-13. doi: 10.1002/cld.1079. PMID: 65515302; PMCID: [id number].   Hepatitis C  [facility] is offering treatment  There is no treatment allowable in pregnant patients.  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 Please refer to After Visit Summary for other counseling recommendations.   Return in 1 week (on 07/09/2023) for LOB/GDM.  Future Appointments  Date Time Provider Department Center  07/09/2023  1:15 PM Emilio Delilah CHRISTELLA EDDY [facility]  07/15/2023  3:15 PM [facility] NURSE [facility]  07/15/2023  3:30 PM [facility] US6 [facility]  07/16/2023  1:15 PM Emilio Delilah CHRISTELLA, CNM [facility]  07/23/2023 11:15 AM Delores Nidia CROME, FNP [facility]    Mardy Shropshire, MD FMOB Fellow, Faculty practice [facility], Center for [facility] 07/02/23  2:02 PM

## 2023-07-03 LAB — CERVICOVAGINAL ANCILLARY ONLY
Chlamydia: NEGATIVE
Comment: NEGATIVE
Comment: NORMAL
Neisseria Gonorrhea: NEGATIVE

## 2023-07-04 ENCOUNTER — Encounter: Payer: Self-pay | Admitting: Certified Nurse Midwife

## 2023-07-07 ENCOUNTER — Other Ambulatory Visit: Payer: Self-pay | Admitting: Family Medicine

## 2023-07-07 ENCOUNTER — Other Ambulatory Visit: Payer: Self-pay | Admitting: Obstetrics and Gynecology

## 2023-07-07 DIAGNOSIS — O24419 Gestational diabetes mellitus in pregnancy, unspecified control: Secondary | ICD-10-CM

## 2023-07-09 ENCOUNTER — Ambulatory Visit (INDEPENDENT_AMBULATORY_CARE_PROVIDER_SITE_OTHER): Payer: Medicaid Other | Admitting: Certified Nurse Midwife

## 2023-07-09 ENCOUNTER — Other Ambulatory Visit: Payer: Self-pay

## 2023-07-09 VITALS — BP 120/76 | HR 107 | Wt 151.7 lb

## 2023-07-09 DIAGNOSIS — Z3A36 36 weeks gestation of pregnancy: Secondary | ICD-10-CM

## 2023-07-09 DIAGNOSIS — Z3493 Encounter for supervision of normal pregnancy, unspecified, third trimester: Secondary | ICD-10-CM

## 2023-07-09 DIAGNOSIS — O24415 Gestational diabetes mellitus in pregnancy, controlled by oral hypoglycemic drugs: Secondary | ICD-10-CM

## 2023-07-12 NOTE — Progress Notes (Signed)
   PRENATAL VISIT NOTE  Subjective:  Carrie Erickson is a 28 y.o. G2P1001 at [redacted]w[redacted]d being seen today for ongoing prenatal care.  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  Patient reports no complaints.  Contractions: Not present. Vag. Bleeding: None.  Movement: Present. Denies leaking of fluid.   The following portions of the patient's history were reviewed and updated as appropriate: allergies, current medications, past family history, past medical history, past social history, past surgical history and problem list.   Objective:   Vitals:   07/09/23 1322  BP: 120/76  Pulse: (!) 107  Weight: 151 lb 11.2 oz (68.8 kg)    Fetal Status: Fetal Heart Rate (bpm): 147 Fundal Height: 34 cm Movement: Present       General:  Alert, oriented and cooperative. Patient is in no acute distress.  Skin: Skin is warm and dry. No rash noted.   Cardiovascular: Normal heart rate noted  Respiratory: Normal respiratory effort, no problems with respiration noted  Abdomen: Soft, gravid, appropriate for gestational age.  Pain/Pressure: Absent     Pelvic: Cervical exam deferred        Extremities: Normal range of motion.  Edema: None  Mental Status: Normal mood and affect. Normal behavior. Normal judgment and thought content.   Assessment and Plan:  Pregnancy: G2P1001 at [redacted]w[redacted]d 1. Encounter for supervision of low-risk pregnancy in third trimester (Primary) - Patient doing well.  - Reports frequent and vigorous fetal movement    2. [redacted] weeks gestation of pregnancy - Culture, beta strep (group b only) - Declined RSV today    3. Gestational diabetes mellitus (GDM) in second trimester controlled on oral hypoglycemic drug -  Fasting 76-96 1 outlier  - PP 83-130 6 outliers  Total less than 15% of CBG outside of recommended rage since 06/16/23. - Schedule patient for IOL at next visit   Term labor symptoms and general obstetric precautions including but not limited to vaginal bleeding, contractions, leaking of fluid and fetal movement were reviewed in detail with the patient. Please refer to After Visit Summary for other counseling recommendations.   Return in about 1 week (around 07/16/2023) for HROB.  Future Appointments  Date Time Provider Department Center  07/15/2023  3:15 PM [facility] NURSE [facility]  07/15/2023  3:30 PM [facility] US6 [facility]  07/16/2023  1:15 PM Carlynn Herald, CNM [facility]  07/23/2023 11:15 AM Sue Lush, FNP [facility]    Makayia Duplessis Danella Deis) Suzie Portela, MSN, CNM  Center for [facility]  07/12/2023 10:06 PM

## 2023-07-13 LAB — CULTURE, BETA STREP (GROUP B ONLY): Strep Gp B Culture: NEGATIVE

## 2023-07-15 ENCOUNTER — Encounter: Payer: Self-pay | Admitting: *Deleted

## 2023-07-15 ENCOUNTER — Ambulatory Visit: Payer: Medicaid Other | Admitting: *Deleted

## 2023-07-15 ENCOUNTER — Other Ambulatory Visit: Payer: Self-pay

## 2023-07-15 ENCOUNTER — Ambulatory Visit: Payer: Medicaid Other | Attending: Obstetrics

## 2023-07-15 ENCOUNTER — Ambulatory Visit: Payer: Medicaid Other | Attending: Obstetrics | Admitting: Obstetrics

## 2023-07-15 ENCOUNTER — Other Ambulatory Visit: Payer: Self-pay | Admitting: Obstetrics and Gynecology

## 2023-07-15 VITALS — BP 120/77 | HR 102

## 2023-07-15 DIAGNOSIS — Z3403 Encounter for supervision of normal first pregnancy, third trimester: Secondary | ICD-10-CM

## 2023-07-15 DIAGNOSIS — O24415 Gestational diabetes mellitus in pregnancy, controlled by oral hypoglycemic drugs: Secondary | ICD-10-CM | POA: Diagnosis not present

## 2023-07-15 DIAGNOSIS — O24410 Gestational diabetes mellitus in pregnancy, diet controlled: Secondary | ICD-10-CM | POA: Diagnosis not present

## 2023-07-15 DIAGNOSIS — O24419 Gestational diabetes mellitus in pregnancy, unspecified control: Secondary | ICD-10-CM | POA: Diagnosis present

## 2023-07-15 DIAGNOSIS — Z3A37 37 weeks gestation of pregnancy: Secondary | ICD-10-CM | POA: Insufficient documentation

## 2023-07-15 DIAGNOSIS — O98413 Viral hepatitis complicating pregnancy, third trimester: Secondary | ICD-10-CM | POA: Diagnosis not present

## 2023-07-15 DIAGNOSIS — O26893 Other specified pregnancy related conditions, third trimester: Secondary | ICD-10-CM | POA: Insufficient documentation

## 2023-07-15 DIAGNOSIS — B1910 Unspecified viral hepatitis B without hepatic coma: Secondary | ICD-10-CM | POA: Diagnosis not present

## 2023-07-15 DIAGNOSIS — Z3A35 35 weeks gestation of pregnancy: Secondary | ICD-10-CM | POA: Insufficient documentation

## 2023-07-15 DIAGNOSIS — O26843 Uterine size-date discrepancy, third trimester: Secondary | ICD-10-CM | POA: Diagnosis not present

## 2023-07-15 NOTE — Progress Notes (Signed)
MFM Consult Note  Carrie Erickson is currently at 37 weeks and 5 days.  She was seen due to gestational diabetes treated with metformin.  She reports that her fingerstick values have mostly been within normal limits.    The overall EFW of 6 pounds 2 ounces measures at the 16th percentile for her gestational age.  There was normal amniotic fluid noted.    A BPP performed today was 8 out of 8.  Due to gestational diabetes, delivery is recommended at around 39 weeks (middle or end of next week).    She will return in 1 week for an NST.    The patient stated that all of her questions were answered today.  A total of 20 minutes was spent counseling and coordinating the care for this patient.  Greater than 50% of the time was spent in direct face-to-face contact.

## 2023-07-16 ENCOUNTER — Other Ambulatory Visit: Payer: Self-pay | Admitting: Certified Nurse Midwife

## 2023-07-16 ENCOUNTER — Ambulatory Visit (INDEPENDENT_AMBULATORY_CARE_PROVIDER_SITE_OTHER): Payer: Medicaid Other | Admitting: Certified Nurse Midwife

## 2023-07-16 VITALS — BP 111/79 | HR 106 | Wt 152.6 lb

## 2023-07-16 DIAGNOSIS — O24410 Gestational diabetes mellitus in pregnancy, diet controlled: Secondary | ICD-10-CM

## 2023-07-16 DIAGNOSIS — O0993 Supervision of high risk pregnancy, unspecified, third trimester: Secondary | ICD-10-CM

## 2023-07-16 DIAGNOSIS — Z3A37 37 weeks gestation of pregnancy: Secondary | ICD-10-CM

## 2023-07-16 NOTE — Progress Notes (Signed)
   PRENATAL VISIT NOTE  Subjective:  Carrie Erickson is a 28 y.o. G2P1001 at [redacted]w[redacted]d being seen today for ongoing prenatal care.  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  Patient reports backache and cramping .   . Vag. Bleeding: None.  Movement: Present. Denies leaking of fluid.   The following portions of the patient's history were reviewed and updated as appropriate: allergies, current medications, past family history, past medical history, past social history, past surgical history and problem list.   Objective:   Vitals:   07/16/23 1335  BP: 111/79  Pulse: (!) 106  Weight: 69.2 kg    Fetal Status: Fetal Heart Rate (bpm): 154   Movement: Present     General:  Alert, oriented and cooperative. Patient is in no acute distress.  Skin: Skin is warm and dry. No rash noted.   Cardiovascular: Normal heart rate noted  Respiratory: Normal respiratory effort, no problems with respiration noted  Abdomen: Soft, gravid, appropriate for gestational age.  Pain/Pressure: Present     Pelvic: Cervical exam deferred        Extremities: Normal range of motion.     Mental Status: Normal mood and affect. Normal behavior. Normal judgment and thought content.    Assessment and Plan:  Pregnancy: G2P1001 at [redacted]w[redacted]d 1. Supervision of high risk pregnancy in third trimester (Primary) -Good FM -Continue ongoing PNC  2. [redacted] weeks gestation of pregnancy -GBS neg  3. Diet controlled gestational diabetes mellitus (GDM) in third trimester -MFM following. Recommend IOL at 39 wks - scheduled for 07/26/23 -8/8 BPP on 1/27. EFW 16%tile. NST in 1 wk -POCT log reviewed: Outliers explained (see log)  Term labor symptoms and general  obstetric precautions including but not limited to vaginal bleeding, contractions, leaking of fluid and fetal movement were reviewed in detail with the patient. Please refer to After Visit Summary for other counseling recommendations.   Return in about 1 week (around 07/23/2023) for HROB.  Future Appointments  Date Time Provider Department Center  07/22/2023 10:45 AM [facility] NST [facility]  07/23/2023 11:15 AM Sue Lush, FNP [facility]    Darrell Jewel, Student-MidWife

## 2023-07-16 NOTE — Progress Notes (Signed)
IOL 07/26/23

## 2023-07-16 NOTE — Progress Notes (Signed)
IOL for A1GDM placed on 07/16/23 for 07/26/2023 by S. Suzie Portela CNM   Carrie Erickson (Danella Deis) Suzie Portela, MSN, CNM  Center for [facility]  07/16/2023 2:29 PM

## 2023-07-21 NOTE — Progress Notes (Unsigned)
   PRENATAL VISIT NOTE  Subjective:  Carrie Erickson is a 28 y.o. G2P1001 at [redacted]w[redacted]d being seen today for ongoing prenatal care.  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  Patient reports {sx:[id number]}.   .  .   . Denies leaking of fluid.   The following portions of the patient's history were reviewed and updated as appropriate: allergies, current medications, past family history, past medical history, past social history, past surgical history and problem list.   Objective:  There were no vitals filed for this visit.  Fetal Status:           General:  Alert, oriented and cooperative. Patient is in no acute distress.  Skin: Skin is warm and dry. No rash noted.   Cardiovascular: Normal heart rate noted  Respiratory: Normal respiratory effort, no problems with respiration noted  Abdomen: Soft, gravid, appropriate for gestational age.        Pelvic: {Blank single:19197::"Cervical exam performed in the presence of a chaperone","Cervical exam deferred"}        Extremities: Normal range of motion.     Mental Status: Normal mood and affect. Normal behavior. Normal judgment and thought content.   Assessment and Plan:  Pregnancy: G2P1001 at [redacted]w[redacted]d 1. Supervision of high risk pregnancy in third trimester (Primary) ***  2. [redacted] weeks gestation of pregnancy ***  3. Diet controlled gestational diabetes mellitus (GDM) in third trimester ***  4. History of hepatitis B ***  5. Rubella non-immune status, antepartum ***  6. 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 Please refer to After Visit Summary for other counseling recommendations.   No follow-ups on file.  Future Appointments  Date Time Provider Department Center  07/22/2023  9:35 AM [facility]  07/22/2023 10:45 AM [facility] NST [facility]  07/26/2023  6:45 AM [facility] SCHED ROOM [facility] None    Richardson Landry, CNM

## 2023-07-22 ENCOUNTER — Ambulatory Visit: Payer: Medicaid Other | Attending: Obstetrics and Gynecology | Admitting: *Deleted

## 2023-07-22 ENCOUNTER — Ambulatory Visit: Payer: Medicaid Other | Admitting: Certified Nurse Midwife

## 2023-07-22 ENCOUNTER — Other Ambulatory Visit: Payer: Self-pay

## 2023-07-22 VITALS — BP 113/80 | HR 92 | Wt 155.0 lb

## 2023-07-22 DIAGNOSIS — O24410 Gestational diabetes mellitus in pregnancy, diet controlled: Secondary | ICD-10-CM

## 2023-07-22 DIAGNOSIS — Z3A38 38 weeks gestation of pregnancy: Secondary | ICD-10-CM

## 2023-07-22 DIAGNOSIS — M7918 Myalgia, other site: Secondary | ICD-10-CM

## 2023-07-22 DIAGNOSIS — O24415 Gestational diabetes mellitus in pregnancy, controlled by oral hypoglycemic drugs: Secondary | ICD-10-CM

## 2023-07-22 DIAGNOSIS — R051 Acute cough: Secondary | ICD-10-CM

## 2023-07-22 DIAGNOSIS — O0993 Supervision of high risk pregnancy, unspecified, third trimester: Secondary | ICD-10-CM

## 2023-07-22 DIAGNOSIS — R0981 Nasal congestion: Secondary | ICD-10-CM

## 2023-07-22 DIAGNOSIS — D582 Other hemoglobinopathies: Secondary | ICD-10-CM

## 2023-07-22 DIAGNOSIS — Z8619 Personal history of other infectious and parasitic diseases: Secondary | ICD-10-CM | POA: Diagnosis not present

## 2023-07-22 DIAGNOSIS — O09893 Supervision of other high risk pregnancies, third trimester: Secondary | ICD-10-CM

## 2023-07-22 DIAGNOSIS — Z2839 Other underimmunization status: Secondary | ICD-10-CM

## 2023-07-22 MED ORDER — FLUTICASONE PROPIONATE 50 MCG/ACT NA SUSP: 2.0000 | Freq: Every day | NASAL | 2 refills | Status: AC

## 2023-07-22 MED ORDER — CYCLOBENZAPRINE HCL 5 MG PO TABS: 5.0000 mg | ORAL_TABLET | Freq: Three times a day (TID) | ORAL | 0 refills | Status: DC | PRN

## 2023-07-22 NOTE — Procedures (Addendum)
Carrie Erickson [birth year] [redacted]w[redacted]d  Fetus A Non-Stress Test Interpretation for 07/22/23 (NST only)  Indication: Gestational Diabetes medication controlled  Fetal Heart Rate A Mode: External Baseline Rate (A): 135 bpm Variability: Moderate Accelerations: 15 x 15 Decelerations: None Multiple birth?: No  Uterine Activity Mode: Palpation, Toco Contraction Frequency (min): 5-6 Contraction Duration (sec): 50-60 Contraction Quality: Mild Resting Tone Palpated: Relaxed Resting Time: Adequate  Interpretation (Fetal Testing) Nonstress Test Interpretation: Reactive Comments: Dr. Judeth Cornfield reviewed tracing.

## 2023-07-23 ENCOUNTER — Telehealth (HOSPITAL_COMMUNITY): Payer: Self-pay | Admitting: *Deleted

## 2023-07-23 ENCOUNTER — Other Ambulatory Visit: Payer: Self-pay | Admitting: Advanced Practice Midwife

## 2023-07-23 ENCOUNTER — Encounter: Payer: Medicaid Other | Admitting: Obstetrics and Gynecology

## 2023-07-23 NOTE — Telephone Encounter (Signed)
 Preadmission screen

## 2023-07-24 ENCOUNTER — Other Ambulatory Visit: Payer: Self-pay

## 2023-07-24 ENCOUNTER — Encounter (HOSPITAL_COMMUNITY): Payer: Self-pay | Admitting: Family Medicine

## 2023-07-24 ENCOUNTER — Inpatient Hospital Stay (HOSPITAL_COMMUNITY)
Admission: AD | Admit: 2023-07-24 | Discharge: 2023-07-25 | DRG: 807 | Disposition: A | Discharge status: Home / Self Care | Payer: Medicaid Other | Attending: Family Medicine | Admitting: Family Medicine

## 2023-07-24 DIAGNOSIS — Z23 Encounter for immunization: Secondary | ICD-10-CM | POA: Diagnosis not present

## 2023-07-24 DIAGNOSIS — Z3A39 39 weeks gestation of pregnancy: Secondary | ICD-10-CM | POA: Diagnosis not present

## 2023-07-24 DIAGNOSIS — O24425 Gestational diabetes mellitus in childbirth, controlled by oral hypoglycemic drugs: Principal | ICD-10-CM | POA: Diagnosis present

## 2023-07-24 DIAGNOSIS — Z8619 Personal history of other infectious and parasitic diseases: Secondary | ICD-10-CM | POA: Diagnosis present

## 2023-07-24 DIAGNOSIS — D582 Other hemoglobinopathies: Secondary | ICD-10-CM

## 2023-07-24 DIAGNOSIS — O09899 Supervision of other high risk pregnancies, unspecified trimester: Secondary | ICD-10-CM

## 2023-07-24 DIAGNOSIS — O0993 Supervision of high risk pregnancy, unspecified, third trimester: Secondary | ICD-10-CM

## 2023-07-24 DIAGNOSIS — Z3480 Encounter for supervision of other normal pregnancy, unspecified trimester: Secondary | ICD-10-CM

## 2023-07-24 DIAGNOSIS — O26893 Other specified pregnancy related conditions, third trimester: Secondary | ICD-10-CM | POA: Diagnosis present

## 2023-07-24 DIAGNOSIS — O24419 Gestational diabetes mellitus in pregnancy, unspecified control: Secondary | ICD-10-CM | POA: Diagnosis present

## 2023-07-24 DIAGNOSIS — O4202 Full-term premature rupture of membranes, onset of labor within 24 hours of rupture: Secondary | ICD-10-CM | POA: Diagnosis not present

## 2023-07-24 DIAGNOSIS — O24415 Gestational diabetes mellitus in pregnancy, controlled by oral hypoglycemic drugs: Secondary | ICD-10-CM

## 2023-07-24 DIAGNOSIS — O24424 Gestational diabetes mellitus in childbirth, insulin controlled: Secondary | ICD-10-CM | POA: Diagnosis not present

## 2023-07-24 LAB — CBC
HCT: 41.3 % (ref 36.0–46.0)
Hemoglobin: 13.8 g/dL (ref 12.0–15.0)
MCH: 26.5 pg (ref 26.0–34.0)
MCHC: 33.4 g/dL (ref 30.0–36.0)
MCV: 79.3 fL — ABNORMAL LOW (ref 80.0–100.0)
Platelets: 273 10*3/uL (ref 150–400)
RBC: 5.21 MIL/uL — ABNORMAL HIGH (ref 3.87–5.11)
RDW: 13.5 % (ref 11.5–15.5)
WBC: 15.6 10*3/uL — ABNORMAL HIGH (ref 4.0–10.5)
nRBC: 0 % (ref 0.0–0.2)

## 2023-07-24 LAB — TYPE AND SCREEN
ABO/RH(D): A POS
Antibody Screen: NEGATIVE

## 2023-07-24 LAB — RPR: RPR Ser Ql: NONREACTIVE

## 2023-07-24 LAB — GLUCOSE, CAPILLARY: Glucose-Capillary: 90 mg/dL (ref 70–99)

## 2023-07-24 MED ORDER — TETANUS-DIPHTH-ACELL PERTUSSIS 5-2.5-18.5 LF-MCG/0.5 IM SUSY
0.5000 mL | PREFILLED_SYRINGE | Freq: Once | INTRAMUSCULAR | Status: AC
  Administered 2023-07-24: 0.5 mL via INTRAMUSCULAR
  Filled 2023-07-24: qty 0.5

## 2023-07-24 MED ORDER — TRANEXAMIC ACID-NACL 1000-0.7 MG/100ML-% IV SOLN
INTRAVENOUS | Status: AC
  Filled 2023-07-24: qty 100

## 2023-07-24 MED ORDER — BENZOCAINE-MENTHOL 20-0.5 % EX AERO
1.0000 | INHALATION_SPRAY | CUTANEOUS | Status: DC | PRN
  Filled 2023-07-24: qty 56

## 2023-07-24 MED ORDER — PRENATAL MULTIVITAMIN CH
1.0000 | ORAL_TABLET | Freq: Every day | ORAL | Status: DC
  Administered 2023-07-24: 1 via ORAL
  Filled 2023-07-24: qty 1

## 2023-07-24 MED ORDER — OXYTOCIN-SODIUM CHLORIDE 30-0.9 UT/500ML-% IV SOLN
INTRAVENOUS | Status: AC
  Filled 2023-07-24: qty 500

## 2023-07-24 MED ORDER — DIBUCAINE (PERIANAL) 1 % EX OINT: 1.0000 | TOPICAL_OINTMENT | CUTANEOUS | Status: DC | PRN

## 2023-07-24 MED ORDER — OXYTOCIN-SODIUM CHLORIDE 30-0.9 UT/500ML-% IV SOLN: 2.5000 [IU]/h | INTRAVENOUS | Status: DC

## 2023-07-24 MED ORDER — MISOPROSTOL 50MCG HALF TABLET: 50.0000 ug | ORAL_TABLET | Freq: Once | ORAL | Status: DC

## 2023-07-24 MED ORDER — IBUPROFEN 800 MG PO TABS
800.0000 mg | ORAL_TABLET | Freq: Three times a day (TID) | ORAL | Status: DC
  Administered 2023-07-24 – 2023-07-25 (×4): 800 mg via ORAL
  Filled 2023-07-24 (×4): qty 1

## 2023-07-24 MED ORDER — WITCH HAZEL-GLYCERIN EX PADS: 1.0000 | MEDICATED_PAD | CUTANEOUS | Status: DC | PRN

## 2023-07-24 MED ORDER — OXYTOCIN-SODIUM CHLORIDE 30-0.9 UT/500ML-% IV SOLN: 1.0000 m[IU]/min | INTRAVENOUS | Status: DC

## 2023-07-24 MED ORDER — DIPHENHYDRAMINE HCL 25 MG PO CAPS: 25.0000 mg | ORAL_CAPSULE | Freq: Four times a day (QID) | ORAL | Status: DC | PRN

## 2023-07-24 MED ORDER — ONDANSETRON HCL 4 MG/2ML IJ SOLN: 4.0000 mg | Freq: Four times a day (QID) | INTRAMUSCULAR | Status: DC | PRN

## 2023-07-24 MED ORDER — MEDROXYPROGESTERONE ACETATE 150 MG/ML IM SUSP: 150.0000 mg | INTRAMUSCULAR | Status: DC | PRN

## 2023-07-24 MED ORDER — TERBUTALINE SULFATE 1 MG/ML IJ SOLN: 0.2500 mg | Freq: Once | INTRAMUSCULAR | Status: DC | PRN

## 2023-07-24 MED ORDER — MEASLES, MUMPS & RUBELLA VAC IJ SOLR: 0.5000 mL | Freq: Once | INTRAMUSCULAR | Status: DC

## 2023-07-24 MED ORDER — SIMETHICONE 80 MG PO CHEW: 80.0000 mg | CHEWABLE_TABLET | ORAL | Status: DC | PRN

## 2023-07-24 MED ORDER — ONDANSETRON HCL 4 MG PO TABS
4.0000 mg | ORAL_TABLET | ORAL | Status: DC | PRN
Start: 2023-07-24 — End: 2023-07-25

## 2023-07-24 MED ORDER — MISOPROSTOL 25 MCG QUARTER TABLET: 25.0000 ug | ORAL_TABLET | Freq: Once | ORAL | Status: DC

## 2023-07-24 MED ORDER — LIDOCAINE HCL (PF) 1 % IJ SOLN
30.0000 mL | INTRAMUSCULAR | Status: DC | PRN
  Administered 2023-07-24: 30 mL via SUBCUTANEOUS
  Filled 2023-07-24: qty 30

## 2023-07-24 MED ORDER — LACTATED RINGERS IV SOLN: 500.0000 mL | INTRAVENOUS | Status: DC | PRN

## 2023-07-24 MED ORDER — LACTATED RINGERS IV SOLN: INTRAVENOUS | Status: DC

## 2023-07-24 MED ORDER — COCONUT OIL OIL: 1.0000 | TOPICAL_OIL | Status: DC | PRN

## 2023-07-24 MED ORDER — OXYTOCIN BOLUS FROM INFUSION
333.0000 mL | Freq: Once | INTRAVENOUS | Status: AC
  Administered 2023-07-24: 333 mL via INTRAVENOUS

## 2023-07-24 MED ORDER — OXYCODONE-ACETAMINOPHEN 5-325 MG PO TABS
1.0000 | ORAL_TABLET | ORAL | Status: DC | PRN
  Administered 2023-07-24: 1 via ORAL
  Filled 2023-07-24: qty 1

## 2023-07-24 MED ORDER — ACETAMINOPHEN 500 MG PO TABS
1000.0000 mg | ORAL_TABLET | Freq: Three times a day (TID) | ORAL | Status: DC
  Administered 2023-07-24 – 2023-07-25 (×4): 1000 mg via ORAL
  Filled 2023-07-24 (×4): qty 2

## 2023-07-24 MED ORDER — OXYCODONE-ACETAMINOPHEN 5-325 MG PO TABS: 2.0000 | ORAL_TABLET | ORAL | Status: DC | PRN

## 2023-07-24 MED ORDER — ZOLPIDEM TARTRATE 5 MG PO TABS: 5.0000 mg | ORAL_TABLET | Freq: Every evening | ORAL | Status: DC | PRN

## 2023-07-24 MED ORDER — SENNOSIDES-DOCUSATE SODIUM 8.6-50 MG PO TABS: 2.0000 | ORAL_TABLET | Freq: Every day | ORAL | Status: DC

## 2023-07-24 MED ORDER — ONDANSETRON HCL 4 MG/2ML IJ SOLN: 4.0000 mg | INTRAMUSCULAR | Status: DC | PRN

## 2023-07-24 MED ORDER — MEASLES, MUMPS & RUBELLA VAC IJ SOLR
0.5000 mL | Freq: Once | INTRAMUSCULAR | Status: AC
  Administered 2023-07-25: 0.5 mL via SUBCUTANEOUS
  Filled 2023-07-24: qty 0.5

## 2023-07-24 MED ORDER — OXYCODONE HCL 5 MG PO TABS: 5.0000 mg | ORAL_TABLET | Freq: Four times a day (QID) | ORAL | Status: DC | PRN

## 2023-07-24 MED ORDER — ACETAMINOPHEN 325 MG PO TABS: 650.0000 mg | ORAL_TABLET | ORAL | Status: DC | PRN

## 2023-07-24 MED ORDER — OXYCODONE HCL 5 MG PO TABS: 10.0000 mg | ORAL_TABLET | Freq: Four times a day (QID) | ORAL | Status: DC | PRN

## 2023-07-24 MED ORDER — SOD CITRATE-CITRIC ACID 500-334 MG/5ML PO SOLN: 30.0000 mL | ORAL | Status: DC | PRN

## 2023-07-24 NOTE — H&P (Signed)
 LABOR AND DELIVERY ADMISSION HISTORY AND PHYSICAL NOTE  Acire Tang is a 28 y.o. female G2P1001 with IUP at [redacted]w[redacted]d presenting for SOL.   Patient reports the fetal movement as active. Patient reports uterine contraction activity as regular, since 2130. Patient reports  vaginal bleeding as scant staining. Patient describes fluid per vagina as None.   Patient denies headache, vision changes, chest pain, shortness of breath, right upper quadrant pain, or LE edema.  She plans on bottle feeding. Her contraception plan is:  unsure .  Prenatal History/Complications: PNC at [facility] - A2GDM: on metformin . 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) 06/03/2023   History of hepatitis B 02/25/2023   Shortness of breath 02/25/2023   Supervision of low-risk first pregnancy 12/20/2017    Past Medical History: Past Medical History:  Diagnosis Date   H/O fetal biophysical profile 12/20/2017   BPP 6/8 on 12/20/2017     Hep B complicating pregnancy 12/20/2017   Hepatitis b    Supervision of normal first pregnancy, antepartum 12/20/2017   SVD (spontaneous vaginal delivery) 12/22/2017    Past Surgical History: Past Surgical History:  Procedure Laterality Date   NO PAST SURGERIES      Obstetrical History: OB History     Gravida  2   Para  1   Term  1   Preterm      AB      Living  1      SAB      IAB      Ectopic      Multiple      Live Births  1           Social History: Social History   Socioeconomic History   Marital status: Single    Spouse name: Not on file   Number of children:  Not on file   Years of education: Not on file   Highest education level: Not on file  Occupational History   Not on file  Tobacco Use   Smoking status: Never   Smokeless tobacco: Never  Substance and Sexual Activity   Alcohol use: No   Drug use: No   Sexual activity: Not Currently    Birth control/protection: None  Other Topics Concern   Not on file  Social History Narrative   Not on file   Social Drivers of Health   Financial Resource Strain: Not on file  Food Insecurity: No Food Insecurity (06/05/2023)   Hunger Vital Sign    Worried About Running Out of Food in the Last Year: Never true    Ran Out of Food in the Last Year: Never true  Transportation Needs: No Transportation Needs (03/05/2023)   PRAPARE - Administrator, Civil Service (Medical): No    Lack of Transportation (Non-Medical): No  Physical Activity: Not on file  Stress: Not on file  Social Connections: Not on file    Family History: No family history on file.  Allergies: No Known Allergies  Medications Prior to Admission  Medication Sig Dispense Refill Last Dose/Taking   Accu-Chek Softclix Lancets lancets Use as instructed 100 each  12    acetaminophen  (TYLENOL ) 80 MG chewable tablet Chew 80 mg by mouth every 6 (six) hours as needed.      Blood Glucose Monitoring Suppl (ACCU-CHEK GUIDE) w/Device KIT TEST MORNING NOON EVENING AND BEDTIME 1 kit 0    Blood Pressure Monitoring DEVI 1 each by Does not apply route once a week. 1 each 0    cyclobenzaprine  (FLEXERIL ) 5 MG tablet Take 1 tablet (5 mg total) by mouth 3 (three) times daily as needed for muscle spasms. 20 tablet 0    doxylamine , Sleep, (UNISOM ) 25 MG tablet Take 25 mg by mouth at bedtime as needed (Nausea).      Doxylamine -Pyridoxine  (DICLEGIS ) 10-10 MG TBEC Take 1 tablet by mouth in the morning and at bedtime. (Patient not taking: Reported on 05/06/2023) 60 tablet 0    fluticasone  (FLONASE ) 50 MCG/ACT nasal spray Place 2 sprays into both  nostrils daily. 16 g 2    glucose blood test strip Use as instructed 100 each 12    metFORMIN  (GLUCOPHAGE ) 500 MG tablet Take 1 tablet (500 mg total) by mouth 2 (two) times daily with a meal. 60 tablet 3    Prenatal Vit-Fe Fumarate-FA (PRENATAL PLUS VITAMIN/MINERAL) 27-1 MG TABS Take 1 tablet by mouth daily. 30 tablet 10    SUMAtriptan  (IMITREX ) 100 MG tablet Take 1 tablet (100 mg total) by mouth once as needed for up to 1 dose for migraine. May repeat in 2 hours if headache persists or recurs. (Patient not taking: Reported on 07/22/2023) 9 tablet 11      Review of Systems  All systems reviewed and negative except as stated in HPI  Physical Exam BP 121/80   Pulse 85   Temp 98.1 F (36.7 C)   Resp 18   Ht 5' 5 (1.651 m)   Wt 68.9 kg   LMP 09/12/2022   SpO2 100%   BMI 25.29 kg/m   Physical Exam Constitutional:      General: She is in acute distress.     Appearance: She is not ill-appearing.     Comments: In labor  Cardiovascular:     Rate and Rhythm: Normal rate and regular rhythm.  Pulmonary:     Effort: Pulmonary effort is normal.  Abdominal:     Comments: Gravid  Musculoskeletal:        General: No swelling.  Skin:    General: Skin is warm and dry.  Neurological:     General: No focal deficit present.  Psychiatric:        Mood and Affect: Mood normal.   Presentation: cephalic by check  Fetal monitoring: Baseline: 125 bpm, Variability: Good {> 6 bpm), Accelerations: Reactive, and Decelerations: Absent Uterine activity: 3  Dilation: 10 Dilation Complete Date: 07/24/23 Dilation Complete Time: 0117 Effacement (%): 90 Station: Plus 2 Presentation: Vertex Exam by:: Jordan Williams RN  Prenatal labs: ABO, Rh: --/--/PENDING (02/05 0105) Antibody: PENDING (02/05 0105) Rubella: <0.90 (09/17 9047) RPR: Non Reactive (11/25 1339)  HBsAg: Confirm. indicated (09/17 9047)  HIV: Non Reactive (11/25 1339)  GC/Chlamydia:  Neisseria Gonorrhea  Date Value Ref Range Status   07/02/2023 Negative  Final   Chlamydia  Date Value Ref Range Status  07/02/2023 Negative  Final   GBS: Negative/-- (01/21 1439)   Prenatal Transfer Tool  Maternal Diabetes: Yes:  Diabetes Type:  Insulin/Medication controlled Genetic Screening: Normal Maternal Ultrasounds/Referrals: Normal Fetal Ultrasounds or other Referrals:  None Maternal Substance Abuse:  No Significant Maternal Medications: metformin  Significant Maternal  Lab Results: Group B Strep negative  Results for orders placed or performed during the hospital encounter of 07/24/23 (from the past 24 hours)  Type and screen   Collection Time: 07/24/23  1:05 AM  Result Value Ref Range   ABO/RH(D) PENDING    Antibody Screen PENDING    Sample Expiration      07/27/2023,2359 Performed at [facility], [address] [place], [place] [address]   CBC   Collection Time: 07/24/23  1:07 AM  Result Value Ref Range   WBC 15.6 (H) 4.0 - 10.5 K/uL   RBC 5.21 (H) 3.87 - 5.11 MIL/uL   Hemoglobin 13.8 12.0 - 15.0 g/dL   HCT 58.6 63.9 - 53.9 %   MCV 79.3 (L) 80.0 - 100.0 fL   MCH 26.5 26.0 - 34.0 pg   MCHC 33.4 30.0 - 36.0 g/dL   RDW 86.4 88.4 - 84.4 %   Platelets 273 150 - 400 K/uL   nRBC 0.0 0.0 - 0.2 %    Assessment: Kumari Cassis is a 28 y.o. G2P1001 at [redacted]w[redacted]d here for SOL.  #Labor: Expectant management. AROM PRN #Pain: IV pain meds PRN, epidural upon request #FHT: Category I #GBS/ID: Negative #MOF: bottle feeding #MOC:  unsure #Circ: No  #A2GDM: well-controlled on metformin . EFW 16%. CBG Q2 active #RNI: MMR PP  Almarie CHRISTELLA Moats, MD [facility], [facility]  07/24/2023, 1:43 AM

## 2023-07-24 NOTE — MAU Note (Signed)
.  Carrie Erickson is a 28 y.o. at [redacted]w[redacted]d here in MAU reporting ctxs since 2130. Reports good FM. Denies LOF. Some bloody show.   LMP: n/a Onset of complaint: 2130 Pain score: 10 Vitals:   07/24/23 0043 07/24/23 0046  BP:  121/80  Pulse: 85   Resp: 18   Temp: 98.1 F (36.7 C)   SpO2: 100%      FHT: 125  Lab orders placed from triage: labor eval

## 2023-07-24 NOTE — Discharge Summary (Signed)
 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 yo [id number] at [redacted]w[redacted]d was admitted in Active Labor on 07/24/2023. Precipitously delivered soon after arrival. Membrane Rupture Time/Date: 1:17 AM,07/24/2023  Delivery Method:Vaginal, Spontaneous Operative Delivery:N/A Episiotomy: None Lacerations:  Perineal;1st degree;Labial Patient had an uncomplicated postpartum course.  She is ambulating, tolerating a regular diet, passing flatus, and urinating well. Her PPD#1 fasting CBG was 82.  Patient is discharged home in stable condition on 07/25/23 per her request for early d/c as long as the baby can go as well.  Newborn Data: Birth date:07/24/2023 Birth time:1:21 AM Gender:Female Living status:Living Apgars:8 ,8  Weight:2890 g (6lb 5.9oz)  Magnesium Sulfate received: No BMZ received: No Rhophylac:No MMR:Yes (intends to get prior to d/c) T-DaP: Declined Flu: No RSV Vaccine received: No Transfusion:No  Immunizations received: Immunization History  Administered Date(s) Administered   Tdap 07/24/2023    Physical exam  Vitals:   07/24/23 1252 07/24/23 1653 07/24/23 2300 07/25/23 0547  BP: 106/67  109/81 103/68 (!) 87/56  Pulse: 80 72 89 82  Resp: 18 17 18 18   Temp: 98 F (36.7 C) 98 F (36.7 C) 98.2 F (36.8 C) 98.2 F (36.8 C)  TempSrc:  Oral Oral Oral  SpO2: 99%  100% 99%  Weight:      Height:       General: alert and cooperative Lochia: appropriate Uterine Fundus: firm Incision: N/A DVT Evaluation: No evidence of DVT seen on physical exam. Labs: Lab Results  Component Value Date   WBC 15.6 (H) 07/24/2023   HGB 13.8 07/24/2023   HCT 41.3 07/24/2023   MCV 79.3 (L) 07/24/2023   PLT 273 07/24/2023      Latest Ref Rng & Units 06/03/2023    3:30 PM  CMP  Glucose 70 - 99 mg/dL 73   BUN 6 - 20 mg/dL 6   Creatinine 9.42 - 8.99 mg/dL 9.45   Sodium 865 - 855 mmol/L 138   Potassium 3.5 - 5.2 mmol/L 4.0   Chloride 96 - 106 mmol/L 103   CO2 20 - 29 mmol/L 20   Calcium 8.7 - 10.2 mg/dL 9.3   Total Protein 6.0 - 8.5 g/dL 6.4   Total Bilirubin 0.0 - 1.2 mg/dL 0.3   Alkaline Phos 44 - 121 IU/L 91   AST 0 - 40 IU/L 14   ALT 0 - 32 IU/L 12    Edinburgh Score:    07/24/2023    2:22 PM  Edinburgh Postnatal Depression Scale Screening Tool  I have been  able to laugh and see the funny side of things. 0  I have looked forward with enjoyment to things. 0  I have blamed myself unnecessarily when things went wrong. 0  I have been anxious or worried for no good reason. 0  I have felt scared or panicky for no good reason. 0  Things have been getting on top of me. 0  I have been so unhappy that I have had difficulty sleeping. 0  I have felt sad or miserable. 0  I have been so unhappy that I have been crying. 0  The thought of harming myself has occurred to me. 0  Edinburgh Postnatal Depression Scale Total 0   Edinburgh Postnatal Depression Scale Total: 0   After visit meds:  Allergies as of 07/25/2023   No Known Allergies      Medication List     STOP taking these medications    Accu-Chek Guide w/Device Kit   Accu-Chek Softclix Lancets lancets   acetaminophen  80  MG chewable tablet Commonly known as: TYLENOL    Blood Pressure Monitoring Devi   cyclobenzaprine  5 MG tablet Commonly known as: FLEXERIL    doxylamine  (Sleep) 25 MG tablet Commonly known as: UNISOM    Doxylamine -Pyridoxine  10-10 MG Tbec Commonly known as: Diclegis    glucose blood test strip   metFORMIN  500 MG tablet Commonly known as: GLUCOPHAGE    SUMAtriptan  100 MG tablet Commonly known as: IMITREX        TAKE these medications    fluticasone  50 MCG/ACT nasal spray Commonly known as: FLONASE  Place 2 sprays into both nostrils daily.   ibuprofen  800 MG tablet Commonly known as: ADVIL  Take 1 tablet (800 mg total) by mouth every 8 (eight) hours as needed.   Prenatal Plus Vitamin/Mineral 27-1 MG Tabs Take 1 tablet by mouth daily.         Discharge home in stable condition Infant Feeding: Bottle Infant Disposition:home with mother Discharge instruction: per After Visit Summary and Postpartum booklet. Activity: Advance as tolerated. Pelvic rest for 6 weeks.  Diet: routine diet Future Appointments: Future Appointments  Date Time Provider Department Center  09/05/2023  2:35 PM Eveline Lynwood MATSU, MD [facility]   Follow up Visit:  Message sent to [facility] 07/24/23  Please schedule this patient for a In person postpartum visit in 6 weeks with the following provider: Any provider. Additional Postpartum F/U: 2 hour GTT  Low risk pregnancy complicated by: GDM Delivery mode: NSVD Anticipated Birth Control:  Unsure   07/25/2023 Suzen JONETTA Gentry, CNM 8:15 AM

## 2023-07-25 LAB — GLUCOSE, CAPILLARY: Glucose-Capillary: 82 mg/dL (ref 70–99)

## 2023-07-25 MED ORDER — IBUPROFEN 800 MG PO TABS: 800.0000 mg | ORAL_TABLET | Freq: Three times a day (TID) | ORAL | 0 refills | Status: AC | PRN

## 2023-07-26 ENCOUNTER — Inpatient Hospital Stay (HOSPITAL_COMMUNITY): Admission: RE | Admit: 2023-07-26 | Payer: Medicaid Other | Source: Home / Self Care

## 2023-07-26 ENCOUNTER — Inpatient Hospital Stay (HOSPITAL_COMMUNITY): Payer: Medicaid Other

## 2023-08-01 ENCOUNTER — Telehealth (HOSPITAL_COMMUNITY): Payer: Self-pay | Admitting: *Deleted

## 2023-08-01 NOTE — Telephone Encounter (Signed)
08/01/2023  Name: Carrie Erickson MRN: [id number] DOB: Apr 16, 1996  Reason for Call:  Transition of Care Hospital Discharge Call  Contact Status: Patient Contact Status: Complete  Language assistant needed:          Follow-Up Questions: Do You Have Any Concerns About Your Health As You Heal From Delivery?: No Do You Have Any Concerns About Your Infants Health?: Yes What Concerns Do You Have About Your Baby?: Patient reported observing a small amount of "clear, yellow fluid" leaking from infant's umbilicus. Reported umbilical cord stump fell off "a couple of days ago." Stated, "It's getting better now. It's drying up." RN reviewed signs of infection to report to MD. None reported at this time. Instructed patient to continue with sponge baths for infant until site is completely healed. Patient verbalized understanding. No further questions or concerns voiced at this time.  Edinburgh Postnatal Depression Scale:  In the Past 7 Days: I have been able to laugh and see the funny side of things.: As much as I always could I have looked forward with enjoyment to things.: As much as I ever did I have blamed myself unnecessarily when things went wrong.: No, never I have been anxious or worried for no good reason.: No, not at all I have felt scared or panicky for no good reason.: No, not at all Things have been getting on top of me.: No, I have been coping as well as ever I have been so unhappy that I have had difficulty sleeping.: Not at all I have felt sad or miserable.: No, not at all I have been so unhappy that I have been crying.: No, never The thought of harming myself has occurred to me.: Never Inocente Salles Postnatal Depression Scale Total: 0  PHQ2-9 Depression Scale:     Discharge Follow-up: Edinburgh score requires follow up?: No Patient was advised of the following resources:: Breastfeeding Support Group, Support Group  Post-discharge interventions: Reviewed Newborn Safe Sleep  Practices  Signature Deforest Hoyles, RN, 08/01/23, 1910

## 2023-08-01 NOTE — Telephone Encounter (Signed)
Attempted hospital discharge follow-up call. Left message for patient to return RN call with any questions or concerns. Deforest Hoyles, RN, 08/01/23, [phone]

## 2023-08-21 ENCOUNTER — Encounter: Payer: Self-pay | Admitting: Family Medicine

## 2023-09-05 ENCOUNTER — Ambulatory Visit: Payer: Medicaid Other | Admitting: Obstetrics & Gynecology

## 2023-09-19 ENCOUNTER — Ambulatory Visit: Admitting: Physician Assistant

## 2023-09-19 ENCOUNTER — Other Ambulatory Visit (HOSPITAL_COMMUNITY)
Admission: RE | Admit: 2023-09-19 | Discharge: 2023-09-19 | Disposition: A | Discharge status: Home / Self Care | Source: Ambulatory Visit | Attending: Physician Assistant | Admitting: Physician Assistant

## 2023-09-19 ENCOUNTER — Other Ambulatory Visit: Payer: Self-pay

## 2023-09-19 DIAGNOSIS — O24419 Gestational diabetes mellitus in pregnancy, unspecified control: Secondary | ICD-10-CM

## 2023-09-19 DIAGNOSIS — Z124 Encounter for screening for malignant neoplasm of cervix: Secondary | ICD-10-CM | POA: Diagnosis present

## 2023-09-19 NOTE — Progress Notes (Signed)
 Post Partum Visit Note  Carrie Erickson is a 28 y.o. G47P2002 female who presents for a postpartum visit. She is 8 weeks 1 day postpartum following a normal spontaneous vaginal delivery.  I have fully reviewed the prenatal and intrapartum course. The delivery was at 39 gestational weeks.  Anesthesia: local. Postpartum course has been good. Baby is doing well. Baby is feeding by bottle, Similac 360 Total Care. Bleeding no bleeding. Bowel function is normal. Bladder function is normal. Patient is not sexually active. Contraception method is none. Postpartum depression screening: negative.   The pregnancy intention screening data noted above was reviewed. Potential methods of contraception were discussed. The patient elected to proceed with No data recorded.    Health Maintenance Due  Topic Date Due   COVID-19 Vaccine (1) Never done   HPV VACCINES (1 - 3-dose series) Never done   Cervical Cancer Screening (Pap smear)  Never done    The following portions of the patient's history were reviewed and updated as appropriate: allergies, current medications, past family history, past medical history, past social history, past surgical history, and problem list.  Review of Systems Pertinent items noted in HPI and remainder of comprehensive ROS otherwise negative.  Objective:  LMP 09/12/2022    General:  alert, cooperative, and appears stated age   Breasts:  Deferred  Lungs: clear to auscultation bilaterally  Heart:  regular rate and rhythm, S1, S2 normal, no murmur, click, rub or gallop  Abdomen: normal findings: no bruits heard and no organomegaly   Wound well approximated incision  GU exam:  normal       Assessment:  1. Postpartum care and examination (Primary) - Patient doing well, 1st degree laceration healing well.  - Patient encouraged to follow up with PCP re: routine health maintenance, hx hep B  2. Cervical cancer screening - Cytology - PAP( [place])  3. Gestational diabetes  mellitus (GDM), antepartum, gestational diabetes method of control unspecified - Glucose tolerance, 2 hours; Future   Plan:   Essential components of care per ACOG recommendations:  1.  Mood and well being: Patient with negative depression screening today. Reviewed local resources for support.  - Patient tobacco use? No.   - hx of drug use? No.    2. Infant care and feeding:  -Patient currently breastmilk feeding? No.  -Social determinants of health (SDOH) reviewed in EPIC. No concerns identified.   3. Sexuality, contraception and birth spacing - Patient does not want a pregnancy in the next year.  Desired family size is 2-3 children.  - Reviewed reproductive life planning. Reviewed contraceptive methods based on pt preferences and effectiveness.  Patient desired barrier methods today.   - Discussed birth spacing of 18 months  4. Sleep and fatigue -Encouraged family/partner/community support of 4 hrs of uninterrupted sleep to help with mood and fatigue  5. Physical Recovery  - Discussed patients delivery and complications. She describes her labor as good. - Patient had a Vaginal, no problems at delivery. Patient had a 1st degree laceration. Perineal healing reviewed. Patient expressed understanding - Patient has urinary incontinence? No. - Patient is safe to resume physical and sexual activity  6.  Health Maintenance - HM due items addressed Yes - Last pap smear No results found for: "DIAGPAP" Pap smear done at today's visit.  -Breast Cancer screening indicated? No.   7. Chronic Disease/Pregnancy Condition follow up: Gestational Diabetes, Hx Hep B  - PCP follow up  Camelia Eng, RN [facility]

## 2023-09-23 LAB — CYTOLOGY - PAP: Diagnosis: NEGATIVE

## 2023-09-24 ENCOUNTER — Other Ambulatory Visit: Payer: Self-pay

## 2023-09-24 ENCOUNTER — Other Ambulatory Visit

## 2023-09-24 DIAGNOSIS — O24419 Gestational diabetes mellitus in pregnancy, unspecified control: Secondary | ICD-10-CM

## 2023-09-25 ENCOUNTER — Encounter: Payer: Self-pay | Admitting: Physician Assistant

## 2023-09-25 LAB — GLUCOSE TOLERANCE, 2 HOURS
Glucose, 2 hour: 83 mg/dL (ref 70–139)
Glucose, GTT - Fasting: 91 mg/dL (ref 70–99)
